# Patient Record
Sex: Male | Born: 1975 | Race: White | Hispanic: No | Marital: Single | Smoking: Former smoker
Health system: Southern US, Community
[De-identification: ages and names within clinical notes are randomized; demographics above are authoritative.]

## PROBLEM LIST (undated history)

## (undated) DIAGNOSIS — G8929 Other chronic pain: Secondary | ICD-10-CM

## (undated) DIAGNOSIS — S62325B Displaced fracture of shaft of fourth metacarpal bone, left hand, initial encounter for open fracture: Secondary | ICD-10-CM

## (undated) DIAGNOSIS — S62323B Displaced fracture of shaft of third metacarpal bone, left hand, initial encounter for open fracture: Secondary | ICD-10-CM

## (undated) DIAGNOSIS — M549 Dorsalgia, unspecified: Secondary | ICD-10-CM

## (undated) DIAGNOSIS — E119 Type 2 diabetes mellitus without complications: Secondary | ICD-10-CM

## (undated) HISTORY — PX: OTHER SURGICAL HISTORY: SHX169

## (undated) HISTORY — DX: Displaced fracture of shaft of fourth metacarpal bone, left hand, initial encounter for open fracture: S62.325B

## (undated) HISTORY — PX: HERNIA REPAIR: SHX51

## (undated) HISTORY — DX: Displaced fracture of shaft of third metacarpal bone, left hand, initial encounter for open fracture: S62.323B

---

## 1999-04-03 ENCOUNTER — Emergency Department (HOSPITAL_COMMUNITY): Admission: EM | Admit: 1999-04-03 | Discharge: 1999-04-03 | Payer: Self-pay | Admitting: Emergency Medicine

## 1999-06-30 ENCOUNTER — Emergency Department (HOSPITAL_COMMUNITY): Admission: EM | Admit: 1999-06-30 | Discharge: 1999-06-30 | Payer: Self-pay | Admitting: Emergency Medicine

## 2000-08-21 ENCOUNTER — Encounter: Payer: Self-pay | Admitting: Emergency Medicine

## 2000-08-21 ENCOUNTER — Emergency Department (HOSPITAL_COMMUNITY): Admission: EM | Admit: 2000-08-21 | Discharge: 2000-08-21 | Payer: Self-pay | Admitting: Emergency Medicine

## 2000-11-08 ENCOUNTER — Emergency Department (HOSPITAL_COMMUNITY): Admission: EM | Admit: 2000-11-08 | Discharge: 2000-11-08 | Payer: Self-pay | Admitting: *Deleted

## 2001-08-17 ENCOUNTER — Emergency Department (HOSPITAL_COMMUNITY): Admission: EM | Admit: 2001-08-17 | Discharge: 2001-08-17 | Payer: Self-pay | Admitting: *Deleted

## 2002-08-22 ENCOUNTER — Emergency Department (HOSPITAL_COMMUNITY): Admission: EM | Admit: 2002-08-22 | Discharge: 2002-08-22 | Payer: Self-pay | Admitting: Emergency Medicine

## 2002-08-22 ENCOUNTER — Encounter: Payer: Self-pay | Admitting: Emergency Medicine

## 2007-07-31 ENCOUNTER — Emergency Department (HOSPITAL_COMMUNITY): Admission: EM | Admit: 2007-07-31 | Discharge: 2007-07-31 | Payer: Self-pay | Admitting: Emergency Medicine

## 2007-08-10 ENCOUNTER — Ambulatory Visit: Payer: Self-pay | Admitting: *Deleted

## 2007-08-10 ENCOUNTER — Ambulatory Visit: Payer: Self-pay | Admitting: Nurse Practitioner

## 2007-08-10 DIAGNOSIS — K5900 Constipation, unspecified: Secondary | ICD-10-CM | POA: Insufficient documentation

## 2007-08-10 DIAGNOSIS — F319 Bipolar disorder, unspecified: Secondary | ICD-10-CM

## 2007-08-10 DIAGNOSIS — M545 Low back pain, unspecified: Secondary | ICD-10-CM | POA: Insufficient documentation

## 2007-08-10 DIAGNOSIS — E109 Type 1 diabetes mellitus without complications: Secondary | ICD-10-CM | POA: Insufficient documentation

## 2007-09-07 ENCOUNTER — Ambulatory Visit: Payer: Self-pay | Admitting: Nurse Practitioner

## 2007-09-07 DIAGNOSIS — J309 Allergic rhinitis, unspecified: Secondary | ICD-10-CM | POA: Insufficient documentation

## 2007-09-07 LAB — CONVERTED CEMR LAB: Blood Glucose, Fingerstick: 107

## 2007-09-14 ENCOUNTER — Telehealth (INDEPENDENT_AMBULATORY_CARE_PROVIDER_SITE_OTHER): Payer: Self-pay | Admitting: Nurse Practitioner

## 2007-09-28 ENCOUNTER — Encounter (INDEPENDENT_AMBULATORY_CARE_PROVIDER_SITE_OTHER): Payer: Self-pay | Admitting: *Deleted

## 2007-12-05 ENCOUNTER — Ambulatory Visit: Payer: Self-pay | Admitting: Nurse Practitioner

## 2007-12-05 DIAGNOSIS — R259 Unspecified abnormal involuntary movements: Secondary | ICD-10-CM | POA: Insufficient documentation

## 2007-12-05 DIAGNOSIS — F329 Major depressive disorder, single episode, unspecified: Secondary | ICD-10-CM | POA: Insufficient documentation

## 2007-12-05 LAB — CONVERTED CEMR LAB
BUN: 10 mg/dL (ref 6–23)
Bilirubin Urine: NEGATIVE
Blood Glucose, Fingerstick: 79
Blood in Urine, dipstick: NEGATIVE
CO2: 26 meq/L (ref 19–32)
Calcium: 9.4 mg/dL (ref 8.4–10.5)
Chloride: 107 meq/L (ref 96–112)
Cholesterol: 159 mg/dL (ref 0–200)
Creatinine, Ser: 1.12 mg/dL (ref 0.40–1.50)
Glucose, Bld: 21 mg/dL — CL (ref 70–99)
Glucose, Urine, Semiquant: NEGATIVE
HDL: 56 mg/dL (ref >39)
Hgb A1c MFr Bld: 7.4 %
Ketones, urine, test strip: NEGATIVE
LDL Cholesterol: 87 mg/dL (ref 0–99)
Nitrite: NEGATIVE
Potassium: 4.2 meq/L (ref 3.5–5.3)
Sodium: 144 meq/L (ref 135–145)
Specific Gravity, Urine: 1.02
Total CHOL/HDL Ratio: 2.8
Triglycerides: 82 mg/dL (ref <150)
Urobilinogen, UA: 0.2
VLDL: 16 mg/dL (ref 0–40)
pH: 6

## 2007-12-06 ENCOUNTER — Encounter (INDEPENDENT_AMBULATORY_CARE_PROVIDER_SITE_OTHER): Payer: Self-pay | Admitting: Nurse Practitioner

## 2007-12-25 ENCOUNTER — Ambulatory Visit: Payer: Self-pay | Admitting: Nurse Practitioner

## 2007-12-25 DIAGNOSIS — K4020 Bilateral inguinal hernia, without obstruction or gangrene, not specified as recurrent: Secondary | ICD-10-CM | POA: Insufficient documentation

## 2007-12-25 LAB — CONVERTED CEMR LAB
Bilirubin Urine: NEGATIVE
Blood Glucose, Fingerstick: 142
Blood in Urine, dipstick: NEGATIVE
Glucose, Urine, Semiquant: 100
Ketones, urine, test strip: NEGATIVE
Nitrite: NEGATIVE
Protein, U semiquant: NEGATIVE
Specific Gravity, Urine: 1.015
Urobilinogen, UA: 0.2
pH: 5.5

## 2007-12-26 ENCOUNTER — Telehealth (INDEPENDENT_AMBULATORY_CARE_PROVIDER_SITE_OTHER): Payer: Self-pay | Admitting: Nurse Practitioner

## 2008-01-25 ENCOUNTER — Encounter (INDEPENDENT_AMBULATORY_CARE_PROVIDER_SITE_OTHER): Payer: Self-pay | Admitting: Nurse Practitioner

## 2010-06-13 LAB — CONVERTED CEMR LAB
ALT: 17 units/L (ref 0–53)
AST: 17 units/L (ref 0–37)
Albumin: 4.6 g/dL (ref 3.5–5.2)
Alkaline Phosphatase: 92 units/L (ref 39–117)
BUN: 14 mg/dL (ref 6–23)
Basophils Absolute: 0 10*3/uL (ref 0.0–0.1)
Basophils Relative: 1 % (ref 0–1)
Bilirubin Urine: NEGATIVE
Blood Glucose, Fingerstick: 125
Blood in Urine, dipstick: NEGATIVE
CO2: 23 meq/L (ref 19–32)
Calcium: 9.2 mg/dL (ref 8.4–10.5)
Chloride: 108 meq/L (ref 96–112)
Creatinine, Ser: 1.02 mg/dL (ref 0.40–1.50)
Eosinophils Absolute: 0.2 10*3/uL (ref 0.0–0.7)
Eosinophils Relative: 3 % (ref 0–5)
Glucose, Bld: 79 mg/dL (ref 70–99)
Glucose, Urine, Semiquant: NEGATIVE
HCT: 46.2 % (ref 39.0–52.0)
Hemoglobin: 16.1 g/dL (ref 13.0–17.0)
Hgb A1c MFr Bld: 7.3 %
Ketones, urine, test strip: NEGATIVE
Lymphocytes Relative: 28 % (ref 12–46)
Lymphs Abs: 2 10*3/uL (ref 0.7–4.0)
MCHC: 34.8 g/dL (ref 30.0–36.0)
MCV: 87 fL (ref 78.0–100.0)
Microalb, Ur: 0.46 mg/dL (ref 0.00–1.89)
Monocytes Absolute: 0.5 10*3/uL (ref 0.1–1.0)
Monocytes Relative: 7 % (ref 3–12)
Neutro Abs: 4.3 10*3/uL (ref 1.7–7.7)
Neutrophils Relative %: 62 % (ref 43–77)
Nitrite: NEGATIVE
Platelets: 251 10*3/uL (ref 150–400)
Potassium: 4.5 meq/L (ref 3.5–5.3)
Protein, U semiquant: NEGATIVE
RBC: 5.31 M/uL (ref 4.22–5.81)
RDW: 13.3 % (ref 11.5–15.5)
Sodium: 143 meq/L (ref 135–145)
Specific Gravity, Urine: 1.015
TSH: 1.22 microintl units/mL (ref 0.350–5.50)
Total Bilirubin: 0.7 mg/dL (ref 0.3–1.2)
Total Protein: 6.9 g/dL (ref 6.0–8.3)
Urobilinogen, UA: 0.2
WBC: 7.1 10*3/uL (ref 4.0–10.5)
pH: 6

## 2012-06-29 DIAGNOSIS — I10 Essential (primary) hypertension: Secondary | ICD-10-CM | POA: Insufficient documentation

## 2013-03-27 DIAGNOSIS — E785 Hyperlipidemia, unspecified: Secondary | ICD-10-CM | POA: Insufficient documentation

## 2014-01-29 ENCOUNTER — Emergency Department (HOSPITAL_BASED_OUTPATIENT_CLINIC_OR_DEPARTMENT_OTHER)
Admission: EM | Admit: 2014-01-29 | Discharge: 2014-01-29 | Disposition: A | Discharge status: Home / Self Care | Payer: Self-pay | Attending: Emergency Medicine | Admitting: Emergency Medicine

## 2014-01-29 ENCOUNTER — Encounter (HOSPITAL_BASED_OUTPATIENT_CLINIC_OR_DEPARTMENT_OTHER): Payer: Self-pay | Admitting: Emergency Medicine

## 2014-01-29 DIAGNOSIS — Z791 Long term (current) use of non-steroidal anti-inflammatories (NSAID): Secondary | ICD-10-CM | POA: Insufficient documentation

## 2014-01-29 DIAGNOSIS — G4700 Insomnia, unspecified: Secondary | ICD-10-CM | POA: Insufficient documentation

## 2014-01-29 DIAGNOSIS — F17200 Nicotine dependence, unspecified, uncomplicated: Secondary | ICD-10-CM | POA: Insufficient documentation

## 2014-01-29 DIAGNOSIS — R739 Hyperglycemia, unspecified: Secondary | ICD-10-CM

## 2014-01-29 DIAGNOSIS — Z79899 Other long term (current) drug therapy: Secondary | ICD-10-CM | POA: Insufficient documentation

## 2014-01-29 DIAGNOSIS — Z880 Allergy status to penicillin: Secondary | ICD-10-CM | POA: Insufficient documentation

## 2014-01-29 DIAGNOSIS — Z9889 Other specified postprocedural states: Secondary | ICD-10-CM | POA: Insufficient documentation

## 2014-01-29 DIAGNOSIS — G8929 Other chronic pain: Secondary | ICD-10-CM | POA: Insufficient documentation

## 2014-01-29 DIAGNOSIS — Z794 Long term (current) use of insulin: Secondary | ICD-10-CM | POA: Insufficient documentation

## 2014-01-29 DIAGNOSIS — E119 Type 2 diabetes mellitus without complications: Secondary | ICD-10-CM | POA: Insufficient documentation

## 2014-01-29 HISTORY — DX: Dorsalgia, unspecified: M54.9

## 2014-01-29 HISTORY — DX: Other chronic pain: G89.29

## 2014-01-29 HISTORY — DX: Type 2 diabetes mellitus without complications: E11.9

## 2014-01-29 LAB — CBC WITH DIFFERENTIAL/PLATELET
BASOS PCT: 1 % (ref 0–1)
Basophils Absolute: 0.1 10*3/uL (ref 0.0–0.1)
Eosinophils Absolute: 0.2 10*3/uL (ref 0.0–0.7)
Eosinophils Relative: 2 % (ref 0–5)
HEMATOCRIT: 43.9 % (ref 39.0–52.0)
Hemoglobin: 15.1 g/dL (ref 13.0–17.0)
Lymphocytes Relative: 19 % (ref 12–46)
Lymphs Abs: 2.1 10*3/uL (ref 0.7–4.0)
MCH: 29.7 pg (ref 26.0–34.0)
MCHC: 34.4 g/dL (ref 30.0–36.0)
MCV: 86.4 fL (ref 78.0–100.0)
MONO ABS: 0.9 10*3/uL (ref 0.1–1.0)
Monocytes Relative: 9 % (ref 3–12)
NEUTROS ABS: 7.5 10*3/uL (ref 1.7–7.7)
Neutrophils Relative %: 69 % (ref 43–77)
Platelets: 286 10*3/uL (ref 150–400)
RBC: 5.08 MIL/uL (ref 4.22–5.81)
RDW: 12.8 % (ref 11.5–15.5)
WBC: 10.7 10*3/uL — ABNORMAL HIGH (ref 4.0–10.5)

## 2014-01-29 LAB — BASIC METABOLIC PANEL
ANION GAP: 12 (ref 5–15)
BUN: 18 mg/dL (ref 6–23)
CALCIUM: 9.5 mg/dL (ref 8.4–10.5)
CO2: 25 mEq/L (ref 19–32)
CREATININE: 0.8 mg/dL (ref 0.50–1.35)
Chloride: 103 mEq/L (ref 96–112)
GFR calc non Af Amer: >90 mL/min (ref >90)
Glucose, Bld: 187 mg/dL — ABNORMAL HIGH (ref 70–99)
Potassium: 4.3 mEq/L (ref 3.7–5.3)
Sodium: 140 mEq/L (ref 137–147)

## 2014-01-29 LAB — CBG MONITORING, ED: GLUCOSE-CAPILLARY: 240 mg/dL — AB (ref 70–99)

## 2014-01-29 NOTE — Discharge Instructions (Signed)

## 2014-01-29 NOTE — ED Provider Notes (Signed)
CSN: [id number]     Arrival date & time 01/29/14  1024 History   First MD Initiated Contact with Patient 01/29/14 1119     Chief Complaint  Patient presents with  . Insomnia     (Consider location/radiation/quality/duration/timing/severity/associated sxs/prior Treatment) HPI Comments: Patient is a 38 year old male with history of diabetes and takes insulin. He presents today for evaluation of erratic blood sugars. [facility] he is undergoing treatment for alcohol abuse. His sugars have been fluctuating between low and high and was sent here by their staff for evaluation. He denies to me he is experiencing any nausea, vomiting, or diarrhea. His only complaints are back pain that makes it difficult for him to get rest at night.  The history is provided by the patient.    Past Medical History  Diagnosis Date  . Chronic back pain   . Diabetes mellitus without complication    Past Surgical History  Procedure Laterality Date  . Hernia repair    . Open heart surgery- valve     No family history on file. History  Substance Use Topics  . Smoking status: Current Every Day Smoker -- 0.50 packs/day    Types: Cigarettes  . Smokeless tobacco: Not on file  . Alcohol Use: Yes     Comment: Last etoh 01/01/2014    Review of Systems  All other systems reviewed and are negative.     Allergies  Penicillins  Home Medications   Prior to Admission medications   Medication Sig Start Date End Date Taking? Authorizing Provider  busPIRone (BUSPAR) 10 MG tablet Take 10 mg by mouth 2 (two) times daily.   Yes Historical Provider, MD  citalopram (CELEXA) 20 MG tablet Take 20 mg by mouth daily.   Yes Historical Provider, MD  insulin glargine (LANTUS) 100 UNIT/ML injection Inject 64 Units into the skin at bedtime.   Yes Historical Provider, MD  insulin lispro (HUMALOG) 100 UNIT/ML injection Inject into the skin 3 (three) times daily before meals.   Yes Historical  Provider, MD  Melatonin 3 MG CAPS Take by mouth.   Yes Historical Provider, MD  meloxicam (MOBIC) 15 MG tablet Take 15 mg by mouth daily.   Yes Historical Provider, MD  pravastatin (PRAVACHOL) 40 MG tablet Take 40 mg by mouth daily.   Yes Historical Provider, MD   BP 139/98  Pulse 75  Temp(Src) 98.7 F (37.1 C) (Oral)  Resp 16  Ht  (1.753 m)  Wt 190 lb (86.183 kg)  BMI 28.05 kg/m2  SpO2 99% Physical Exam  Nursing note and vitals reviewed. Constitutional: He is oriented to person, place, and time. He appears well-developed and well-nourished. No distress.  HENT:  Head: Normocephalic and atraumatic.  Neck: Normal range of motion. Neck supple.  Cardiovascular: Normal rate, regular rhythm and normal heart sounds.   No murmur heard. Pulmonary/Chest: Effort normal and breath sounds normal. No respiratory distress. He has no wheezes.  Abdominal: Soft. Bowel sounds are normal.  Musculoskeletal: Normal range of motion. He exhibits no edema.  Neurological: He is alert and oriented to person, place, and time.  Skin: Skin is warm and dry. He is not diaphoretic.    ED Course  Procedures (including critical care time) Labs Review Labs Reviewed  CBG MONITORING, ED - Abnormal; Notable for the following:    Glucose-Capillary 240 (*)    All other components within normal limits  CBC WITH DIFFERENTIAL  BASIC METABOLIC PANEL  Imaging Review No results found.   EKG Interpretation None      MDM   Final diagnoses:  None    Laboratory studies are unremarkable. I see nothing emergent that needs tended to. He has an appointment tomorrow with his primary Dr. and I feel as though he is very stable to discuss his sugars at that time.    Geoffery Lyons, MD 01/29/14 [phone]

## 2014-01-29 NOTE — ED Notes (Signed)
Insomnia, chronic back pain and feels like BS is not regulated.  Fasting CBG 68 this am and "jumped up to the 400's" .  Resides at [facility] x 1 week.

## 2014-05-25 ENCOUNTER — Emergency Department (HOSPITAL_COMMUNITY)
Admission: EM | Admit: 2014-05-25 | Discharge: 2014-05-25 | Disposition: A | Discharge status: Home / Self Care | Payer: Self-pay | Attending: Emergency Medicine | Admitting: Emergency Medicine

## 2014-05-25 ENCOUNTER — Encounter (HOSPITAL_COMMUNITY): Payer: Self-pay | Admitting: *Deleted

## 2014-05-25 DIAGNOSIS — G8929 Other chronic pain: Secondary | ICD-10-CM | POA: Insufficient documentation

## 2014-05-25 DIAGNOSIS — Z794 Long term (current) use of insulin: Secondary | ICD-10-CM | POA: Insufficient documentation

## 2014-05-25 DIAGNOSIS — Z79899 Other long term (current) drug therapy: Secondary | ICD-10-CM | POA: Insufficient documentation

## 2014-05-25 DIAGNOSIS — Z720 Tobacco use: Secondary | ICD-10-CM | POA: Insufficient documentation

## 2014-05-25 DIAGNOSIS — Z791 Long term (current) use of non-steroidal anti-inflammatories (NSAID): Secondary | ICD-10-CM | POA: Insufficient documentation

## 2014-05-25 DIAGNOSIS — E1165 Type 2 diabetes mellitus with hyperglycemia: Secondary | ICD-10-CM | POA: Insufficient documentation

## 2014-05-25 DIAGNOSIS — Z880 Allergy status to penicillin: Secondary | ICD-10-CM | POA: Insufficient documentation

## 2014-05-25 DIAGNOSIS — R739 Hyperglycemia, unspecified: Secondary | ICD-10-CM

## 2014-05-25 LAB — CBC WITH DIFFERENTIAL/PLATELET
BASOS PCT: 0 % (ref 0–1)
Basophils Absolute: 0 10*3/uL (ref 0.0–0.1)
EOS PCT: 1 % (ref 0–5)
Eosinophils Absolute: 0.1 10*3/uL (ref 0.0–0.7)
HCT: 42.9 % (ref 39.0–52.0)
Hemoglobin: 14.8 g/dL (ref 13.0–17.0)
LYMPHS ABS: 2 10*3/uL (ref 0.7–4.0)
LYMPHS PCT: 26 % (ref 12–46)
MCH: 29.6 pg (ref 26.0–34.0)
MCHC: 34.5 g/dL (ref 30.0–36.0)
MCV: 85.8 fL (ref 78.0–100.0)
MONO ABS: 0.8 10*3/uL (ref 0.1–1.0)
Monocytes Relative: 10 % (ref 3–12)
NEUTROS ABS: 4.9 10*3/uL (ref 1.7–7.7)
Neutrophils Relative %: 63 % (ref 43–77)
Platelets: 214 10*3/uL (ref 150–400)
RBC: 5 MIL/uL (ref 4.22–5.81)
RDW: 12.6 % (ref 11.5–15.5)
WBC: 7.8 10*3/uL (ref 4.0–10.5)

## 2014-05-25 LAB — COMPREHENSIVE METABOLIC PANEL
ALT: 53 U/L (ref 0–53)
AST: 50 U/L — ABNORMAL HIGH (ref 0–37)
Albumin: 4 g/dL (ref 3.5–5.2)
Alkaline Phosphatase: 83 U/L (ref 39–117)
Anion gap: 14 (ref 5–15)
BILIRUBIN TOTAL: 1.1 mg/dL (ref 0.3–1.2)
BUN: 11 mg/dL (ref 6–23)
CALCIUM: 9.9 mg/dL (ref 8.4–10.5)
CO2: 27 mmol/L (ref 19–32)
CREATININE: 0.84 mg/dL (ref 0.50–1.35)
Chloride: 96 mEq/L (ref 96–112)
GFR calc Af Amer: >90 mL/min (ref >90)
GFR calc non Af Amer: >90 mL/min (ref >90)
GLUCOSE: 420 mg/dL — AB (ref 70–99)
Potassium: 3.7 mmol/L (ref 3.5–5.1)
Sodium: 137 mmol/L (ref 135–145)
Total Protein: 6.3 g/dL (ref 6.0–8.3)

## 2014-05-25 LAB — CBG MONITORING, ED
GLUCOSE-CAPILLARY: 422 mg/dL — AB (ref 70–99)
Glucose-Capillary: 409 mg/dL — ABNORMAL HIGH (ref 70–99)
Glucose-Capillary: 445 mg/dL — ABNORMAL HIGH (ref 70–99)
Glucose-Capillary: 453 mg/dL — ABNORMAL HIGH (ref 70–99)
Glucose-Capillary: 118 mg/dL — ABNORMAL HIGH (ref 70–99)

## 2014-05-25 LAB — URINALYSIS, ROUTINE W REFLEX MICROSCOPIC
Bilirubin Urine: NEGATIVE
HGB URINE DIPSTICK: NEGATIVE
KETONES UR: 15 mg/dL — AB
LEUKOCYTES UA: NEGATIVE
NITRITE: NEGATIVE
Protein, ur: NEGATIVE mg/dL
SPECIFIC GRAVITY, URINE: 1.039 — AB (ref 1.005–1.030)
UROBILINOGEN UA: 0.2 mg/dL (ref 0.0–1.0)
pH: 7.5 (ref 5.0–8.0)

## 2014-05-25 LAB — I-STAT TROPONIN, ED: TROPONIN I, POC: 0 ng/mL (ref 0.00–0.08)

## 2014-05-25 LAB — URINE MICROSCOPIC-ADD ON: URINE-OTHER: NONE SEEN

## 2014-05-25 LAB — LIPASE, BLOOD: Lipase: 17 U/L (ref 11–59)

## 2014-05-25 MED ORDER — SODIUM CHLORIDE 0.9 % IV BOLUS (SEPSIS): 1000.0000 mL | Freq: Once | INTRAVENOUS | Status: DC

## 2014-05-25 MED ORDER — SODIUM CHLORIDE 0.9 % IV SOLN
12.5000 mg | Freq: Once | INTRAVENOUS | Status: AC
  Administered 2014-05-25: 12.5 mg via INTRAVENOUS
  Filled 2014-05-25: qty 0.5

## 2014-05-25 MED ORDER — ONDANSETRON 4 MG PO TBDP: 4.0000 mg | ORAL_TABLET | Freq: Three times a day (TID) | ORAL | Status: DC | PRN

## 2014-05-25 MED ORDER — ONDANSETRON HCL 4 MG/2ML IJ SOLN
4.0000 mg | Freq: Once | INTRAMUSCULAR | Status: AC
  Administered 2014-05-25: 4 mg via INTRAVENOUS
  Filled 2014-05-25: qty 2

## 2014-05-25 MED ORDER — SODIUM CHLORIDE 0.9 % IV BOLUS (SEPSIS)
1000.0000 mL | Freq: Once | INTRAVENOUS | Status: AC
  Administered 2014-05-25: 1000 mL via INTRAVENOUS

## 2014-05-25 MED ORDER — PROMETHAZINE HCL 25 MG/ML IJ SOLN: 12.5000 mg | Freq: Once | INTRAMUSCULAR | Status: DC

## 2014-05-25 MED ORDER — METOCLOPRAMIDE HCL 5 MG/ML IJ SOLN
10.0000 mg | Freq: Once | INTRAMUSCULAR | Status: AC
  Administered 2014-05-25: 10 mg via INTRAVENOUS
  Filled 2014-05-25: qty 2

## 2014-05-25 MED ORDER — INSULIN ASPART 100 UNIT/ML ~~LOC~~ SOLN
10.0000 [IU] | Freq: Once | SUBCUTANEOUS | Status: AC
  Administered 2014-05-25: 10 [IU] via SUBCUTANEOUS
  Filled 2014-05-25: qty 1

## 2014-05-25 NOTE — ED Provider Notes (Signed)
CSN: [id number]     Arrival date & time 05/25/14  4098 History   First MD Initiated Contact with Patient 05/25/14 [phone]     Chief Complaint  Patient presents with  . Hyperglycemia  . Chest Pain  . Abdominal Pain  . Emesis     (Consider location/radiation/quality/duration/timing/severity/associated sxs/prior Treatment) HPI Comments: Patient presents with nausea vomiting and chest pain. He has a history of insulin-dependent diabetes and hyperlipidemia. He was recently admitted to [facility] hospital for hyperglycemia. He states that over the last 3-4 days he's felt like his sugars been high again these had ongoing nausea and vomiting. He's also had a 4 day history of chest pain associated with the vomiting. He has a burning in his esophagus as well as a tightness across his chest it's been fairly constant for the last 4 days. He also feels short of breath at times. He has a nonproductive cough. He denies any known fevers. He denies any diarrhea. He has some crampy pain to his abdomen. 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 He denies any drug use. He denies any recent alcohol use.  Patient is a 39 y.o. male presenting with hyperglycemia, chest pain, abdominal pain, and vomiting.  Hyperglycemia Associated symptoms: abdominal pain, chest pain, fatigue, nausea, shortness of breath and vomiting   Associated symptoms: no diaphoresis, no dizziness and no fever   Chest Pain Associated symptoms: abdominal pain, fatigue, nausea, shortness of breath and vomiting   Associated symptoms: no back pain, no cough, no diaphoresis, no dizziness, no fever, no headache, no numbness and no weakness   Abdominal Pain Associated symptoms: chest pain, fatigue, nausea, shortness of breath and vomiting   Associated symptoms: no chills, no cough, no diarrhea, no fever and no hematuria   Emesis Associated  symptoms: abdominal pain   Associated symptoms: no arthralgias, no chills, no diarrhea and no headaches     Past Medical History  Diagnosis Date  . Chronic back pain   . Diabetes mellitus without complication    Past Surgical History  Procedure Laterality Date  . Hernia repair    . Open heart surgery- valve     History reviewed. No pertinent family history. History  Substance Use Topics  . Smoking status: Current Every Day Smoker -- 0.50 packs/day    Types: Cigarettes  . Smokeless tobacco: Not on file  . Alcohol Use: Yes     Comment: Last etoh 01/01/2014    Review of Systems  Constitutional: Positive for fatigue. Negative for fever, chills and diaphoresis.  HENT: Negative for congestion, rhinorrhea and sneezing.   Eyes: Negative.   Respiratory: Positive for shortness of breath. Negative for cough and chest tightness.   Cardiovascular: Positive for chest pain. Negative for leg swelling.  Gastrointestinal: Positive for nausea, vomiting and abdominal pain. Negative for diarrhea and blood in stool.  Genitourinary: Negative for frequency, hematuria, flank pain and difficulty urinating.  Musculoskeletal: Negative for back pain and arthralgias.  Skin: Negative for rash.  Neurological: Negative for dizziness, speech difficulty, weakness, numbness and headaches.      Allergies  Penicillins  Home Medications   Prior to Admission medications   Medication Sig Start Date End Date Taking? Authorizing Provider  busPIRone (BUSPAR) 10 MG tablet Take 10 mg by mouth 2 (two) times daily as needed (for anxiety).    Yes Historical Provider, MD  citalopram (CELEXA)  20 MG tablet Take 20 mg by mouth daily.   Yes Historical Provider, MD  gabapentin (NEURONTIN) 300 MG capsule Take 300 mg by mouth 3 (three) times daily.   Yes Historical Provider, MD  hydrOXYzine (VISTARIL) 25 MG capsule Take 25 mg by mouth 3 (three) times daily as needed for anxiety.   Yes Historical Provider, MD  insulin  detemir (LEVEMIR) 100 UNIT/ML injection Inject 45 Units into the skin every evening.   Yes Historical Provider, MD  insulin glargine (LANTUS) 100 UNIT/ML injection Inject 45 Units into the skin every evening.    Yes Historical Provider, MD  insulin lispro (HUMALOG) 100 UNIT/ML injection Inject 3-18 Units into the skin 3 (three) times daily before meals. Sliding scale   Yes Historical Provider, MD  meloxicam (MOBIC) 15 MG tablet Take 15 mg by mouth daily.   Yes Historical Provider, MD  pravastatin (PRAVACHOL) 40 MG tablet Take 40 mg by mouth daily.   Yes Historical Provider, MD   BP 151/98 mmHg  Pulse 72  Temp(Src) 97.9 F (36.6 C) (Oral)  Resp 22  SpO2 97% Physical Exam  Constitutional: He is oriented to person, place, and time. He appears well-developed and well-nourished.  HENT:  Head: Normocephalic and atraumatic.  Mouth/Throat: Oropharynx is clear and moist.  Eyes: Pupils are equal, round, and reactive to light.  Neck: Normal range of motion. Neck supple.  Cardiovascular: Normal rate, regular rhythm and normal heart sounds.   Pulmonary/Chest: Effort normal and breath sounds normal. No respiratory distress. He has no wheezes. He has no rales. He exhibits no tenderness.  Abdominal: Soft. Bowel sounds are normal. There is no tenderness. There is no rebound and no guarding.  Musculoskeletal: Normal range of motion. He exhibits no edema.  Lymphadenopathy:    He has no cervical adenopathy.  Neurological: He is alert and oriented to person, place, and time.  Skin: Skin is warm and dry. No rash noted.  Psychiatric: He has a normal mood and affect.    ED Course  Procedures (including critical care time) Labs Review Labs Reviewed  COMPREHENSIVE METABOLIC PANEL - Abnormal; Notable for the following:    Glucose, Bld 420 (*)    AST 50 (*)    All other components within normal limits  URINALYSIS, ROUTINE W REFLEX MICROSCOPIC - Abnormal; Notable for the following:    Specific Gravity,  Urine 1.039 (*)    Glucose, UA >1000 (*)    Ketones, ur 15 (*)    All other components within normal limits  CBG MONITORING, ED - Abnormal; Notable for the following:    Glucose-Capillary 409 (*)    All other components within normal limits  CBG MONITORING, ED - Abnormal; Notable for the following:    Glucose-Capillary 445 (*)    All other components within normal limits  CBG MONITORING, ED - Abnormal; Notable for the following:    Glucose-Capillary 422 (*)    All other components within normal limits  CBG MONITORING, ED - Abnormal; Notable for the following:    Glucose-Capillary 453 (*)    All other components within normal limits  LIPASE, BLOOD  CBC WITH DIFFERENTIAL  URINE MICROSCOPIC-ADD ON  I-STAT TROPOININ, ED    Imaging Review No results found.   EKG Interpretation None      MDM   Final diagnoses:  Hyperglycemia   Patient with ST elevation inferiorly and anteriorly with no reciprocal changes. I will consult cardiology to look at the EKG.  10:17 spoke with Dr. Excell Seltzerooper  who will take a look at the EKG and call me back  10:25 Dr. Excell Seltzer feels EKG most consistent with early repol changes.  Trop negative.  Patient has no evidence of DKA. He's feeling much better after IV fluids. I feel he can be discharged home once his sugar is improving. He's been given insulin. Dr. Fayrene Fearing to discharge once his sugar has improved.  Rolan Bucco, MD 05/25/14 [phone]

## 2014-05-25 NOTE — ED Notes (Signed)
Bed: [id number] Expected date: 05/25/14 Expected time: 9:45 AM Means of arrival: Ambulance Comments: Hyperglycemia/ N/V

## 2014-05-25 NOTE — Discharge Instructions (Signed)
Hyperglycemia °Hyperglycemia occurs when the glucose (sugar) in your blood is too high. Hyperglycemia can happen for many reasons, but it most often happens to people who do not know they have diabetes or are not managing their diabetes properly.  °CAUSES  °Whether you have diabetes or not, there are other causes of hyperglycemia. Hyperglycemia can occur when you have diabetes, but it can also occur in other situations that you might not be as aware of, such as: °Diabetes °· If you have diabetes and are having problems controlling your blood glucose, hyperglycemia could occur because of some of the following reasons: °¨ Not following your meal plan. °¨ Not taking your diabetes medications or not taking it properly. °¨ Exercising less or doing less activity than you normally do. °¨ Being sick. °Pre-diabetes °· This cannot be ignored. Before people develop Type 2 diabetes, they almost always have "pre-diabetes." This is when your blood glucose levels are higher than normal, but not yet high enough to be diagnosed as diabetes. Research has shown that some long-term damage to the body, especially the heart and circulatory system, may already be occurring during pre-diabetes. If you take action to manage your blood glucose when you have pre-diabetes, you may delay or prevent Type 2 diabetes from developing. °Stress °· If you have diabetes, you may be "diet" controlled or on oral medications or insulin to control your diabetes. However, you may find that your blood glucose is higher than usual in the hospital whether you have diabetes or not. This is often referred to as "stress hyperglycemia." Stress can elevate your blood glucose. This happens because of hormones put out by the body during times of stress. If stress has been the cause of your high blood glucose, it can be followed regularly by your caregiver. That way he/she can make sure your hyperglycemia does not continue to get worse or progress to  diabetes. °Steroids °· Steroids are medications that act on the infection fighting system (immune system) to block inflammation or infection. One side effect can be a rise in blood glucose. Most people can produce enough extra insulin to allow for this rise, but for those who cannot, steroids make blood glucose levels go even higher. It is not unusual for steroid treatments to "uncover" diabetes that is developing. It is not always possible to determine if the hyperglycemia will go away after the steroids are stopped. A special blood test called an A1c is sometimes done to determine if your blood glucose was elevated before the steroids were started. °SYMPTOMS °· Thirsty. °· Frequent urination. °· Dry mouth. °· Blurred vision. °· Tired or fatigue. °· Weakness. °· Sleepy. °· Tingling in feet or leg. °DIAGNOSIS  °Diagnosis is made by monitoring blood glucose in one or all of the following ways: °· A1c test. This is a chemical found in your blood. °· Fingerstick blood glucose monitoring. °· Laboratory results. °TREATMENT  °First, knowing the cause of the hyperglycemia is important before the hyperglycemia can be treated. Treatment may include, but is not be limited to: °· Education. °· Change or adjustment in medications. °· Change or adjustment in meal plan. °· Treatment for an illness, infection, etc. °· More frequent blood glucose monitoring. °· Change in exercise plan. °· Decreasing or stopping steroids. °· Lifestyle changes. °HOME CARE INSTRUCTIONS  °· Test your blood glucose as directed. °· Exercise regularly. Your caregiver will give you instructions about exercise. Pre-diabetes or diabetes which comes on with stress is helped by exercising. °· Eat wholesome,   balanced meals. Eat often and at regular, fixed times. Your caregiver or nutritionist will give you a meal plan to guide your sugar intake.  Being at an ideal weight is important. If needed, losing as little as 10 to 15 pounds may help improve blood  glucose levels. SEEK MEDICAL CARE IF:   You have questions about medicine, activity, or diet.  You continue to have symptoms (problems such as increased thirst, urination, or weight gain). SEEK IMMEDIATE MEDICAL CARE IF:   You are vomiting or have diarrhea.  Your breath smells fruity.  You are breathing faster or slower.  You are very sleepy or incoherent.  You have numbness, tingling, or pain in your feet or hands.  You have chest pain.  Your symptoms get worse even though you have been following your caregiver's orders.  If you have any other questions or concerns. Document Released: 10/26/2000 Document Revised: 07/25/2011 Document Reviewed: 08/29/2011 [facility] Patient Information 2015 [place], [place]. This information is not intended to replace advice given to you by your health care provider. Make sure you discuss any questions you have with your health care provider.  Hyperglycemia Hyperglycemia occurs when the glucose (sugar) in your blood is too high. Hyperglycemia can happen for many reasons, but it most often happens to people who do not know they have diabetes or are not managing their diabetes properly.  CAUSES  Whether you have diabetes or not, there are other causes of hyperglycemia. Hyperglycemia can occur when you have diabetes, but it can also occur in other situations that you might not be as aware of, such as: Diabetes  If you have diabetes and are having problems controlling your blood glucose, hyperglycemia could occur because of some of the following reasons:  Not following your meal plan.  Not taking your diabetes medications or not taking it properly.  Exercising less or doing less activity than you normally do.  Being sick. Pre-diabetes  This cannot be ignored. Before people develop Type 2 diabetes, they almost always have "pre-diabetes." This is when your blood glucose levels are higher than normal, but not yet high enough to be diagnosed as  diabetes. Research has shown that some long-term damage to the body, especially the heart and circulatory system, may already be occurring during pre-diabetes. If you take action to manage your blood glucose when you have pre-diabetes, you may delay or prevent Type 2 diabetes from developing. Stress  If you have diabetes, you may be "diet" controlled or on oral medications or insulin to control your diabetes. However, you may find that your blood glucose is higher than usual in the hospital whether you have diabetes or not. This is often referred to as "stress hyperglycemia." Stress can elevate your blood glucose. This happens because of hormones put out by the body during times of stress. If stress has been the cause of your high blood glucose, it can be followed regularly by your caregiver. That way he/she can make sure your hyperglycemia does not continue to get worse or progress to diabetes. Steroids  Steroids are medications that act on the infection fighting system (immune system) to block inflammation or infection. One side effect can be a rise in blood glucose. Most people can produce enough extra insulin to allow for this rise, but for those who cannot, steroids make blood glucose levels go even higher. It is not unusual for steroid treatments to "uncover" diabetes that is developing. It is not always possible to determine if the hyperglycemia will go  away after the steroids are stopped. A special blood test called an A1c is sometimes done to determine if your blood glucose was elevated before the steroids were started. SYMPTOMS  Thirsty.  Frequent urination.  Dry mouth.  Blurred vision.  Tired or fatigue.  Weakness.  Sleepy.  Tingling in feet or leg. DIAGNOSIS  Diagnosis is made by monitoring blood glucose in one or all of the following ways:  A1c test. This is a chemical found in your blood.  Fingerstick blood glucose monitoring.  Laboratory results. TREATMENT  First,  knowing the cause of the hyperglycemia is important before the hyperglycemia can be treated. Treatment may include, but is not be limited to:  Education.  Change or adjustment in medications.  Change or adjustment in meal plan.  Treatment for an illness, infection, etc.  More frequent blood glucose monitoring.  Change in exercise plan.  Decreasing or stopping steroids.  Lifestyle changes. HOME CARE INSTRUCTIONS   Test your blood glucose as directed.  Exercise regularly. Your caregiver will give you instructions about exercise. Pre-diabetes or diabetes which comes on with stress is helped by exercising.  Eat wholesome, balanced meals. Eat often and at regular, fixed times. Your caregiver or nutritionist will give you a meal plan to guide your sugar intake.  Being at an ideal weight is important. If needed, losing as little as 10 to 15 pounds may help improve blood glucose levels. SEEK MEDICAL CARE IF:   You have questions about medicine, activity, or diet.  You continue to have symptoms (problems such as increased thirst, urination, or weight gain). SEEK IMMEDIATE MEDICAL CARE IF:   You are vomiting or have diarrhea.  Your breath smells fruity.  You are breathing faster or slower.  You are very sleepy or incoherent.  You have numbness, tingling, or pain in your feet or hands.  You have chest pain.  Your symptoms get worse even though you have been following your caregiver's orders.  If you have any other questions or concerns. Document Released: 10/26/2000 Document Revised: 07/25/2011 Document Reviewed: 08/29/2011 [facility] Patient Information 2015 [place], [place]. This information is not intended to replace advice given to you by your health care provider. Make sure you discuss any questions you have with your health care provider.

## 2014-05-25 NOTE — ED Provider Notes (Signed)
Patient recheck. Care was discussed with Dr. Fredderick PhenixBelfi.  Check blood sugar 125. [place] [facility]. Which changed from Lantus to let her. Recommended he continue his prescribed doses and his sliding scale for his regular insulin. Primary care follow-up. Return if worsening symptoms. Not acidotic. Taking by mouth food and fluid without difficulty here.  [name], MD 05/25/14 [phone]

## 2014-05-25 NOTE — ED Notes (Addendum)
Per ems pt c/o hyperglycemia x4 days, with nausea and vomiting x3 days. Pt was seen and treated at [facility] 4 days ago with DKA. Pt complaint with medications. Pt reports he felt his sugar was high last night so he took his lantus and novalog, still feels like it is high, ems CBG 299. Abdominal pain 7/10. Non productive cough present. Afebrile.   Upon further assessment pt reports chest pain x3 days, "feels like someone is stomping on my chest", reports SOB. Able to  Speak in full sentences.

## 2014-05-25 NOTE — ED Notes (Signed)
Pt escorted to discharge window. Pt verbalized understanding discharge instructions. In no acute distress.  

## 2014-05-25 NOTE — ED Notes (Addendum)
Pt tolerated diet gingerale well. 

## 2016-01-06 ENCOUNTER — Emergency Department (HOSPITAL_COMMUNITY): Payer: Self-pay

## 2016-01-06 ENCOUNTER — Emergency Department (HOSPITAL_COMMUNITY)
Admission: EM | Admit: 2016-01-06 | Discharge: 2016-01-07 | Disposition: A | Discharge status: Home / Self Care | Payer: Self-pay | Attending: Emergency Medicine | Admitting: Emergency Medicine

## 2016-01-06 ENCOUNTER — Encounter (HOSPITAL_COMMUNITY): Payer: Self-pay

## 2016-01-06 DIAGNOSIS — E109 Type 1 diabetes mellitus without complications: Secondary | ICD-10-CM | POA: Insufficient documentation

## 2016-01-06 DIAGNOSIS — Y92512 Supermarket, store or market as the place of occurrence of the external cause: Secondary | ICD-10-CM | POA: Insufficient documentation

## 2016-01-06 DIAGNOSIS — S8001XA Contusion of right knee, initial encounter: Secondary | ICD-10-CM | POA: Insufficient documentation

## 2016-01-06 DIAGNOSIS — M542 Cervicalgia: Secondary | ICD-10-CM | POA: Insufficient documentation

## 2016-01-06 DIAGNOSIS — F17210 Nicotine dependence, cigarettes, uncomplicated: Secondary | ICD-10-CM | POA: Insufficient documentation

## 2016-01-06 DIAGNOSIS — S0181XA Laceration without foreign body of other part of head, initial encounter: Secondary | ICD-10-CM | POA: Insufficient documentation

## 2016-01-06 DIAGNOSIS — S060X0A Concussion without loss of consciousness, initial encounter: Secondary | ICD-10-CM | POA: Insufficient documentation

## 2016-01-06 DIAGNOSIS — IMO0002 Reserved for concepts with insufficient information to code with codable children: Secondary | ICD-10-CM

## 2016-01-06 DIAGNOSIS — Y999 Unspecified external cause status: Secondary | ICD-10-CM | POA: Insufficient documentation

## 2016-01-06 DIAGNOSIS — W500XXA Accidental hit or strike by another person, initial encounter: Secondary | ICD-10-CM | POA: Insufficient documentation

## 2016-01-06 DIAGNOSIS — S8002XA Contusion of left knee, initial encounter: Secondary | ICD-10-CM | POA: Insufficient documentation

## 2016-01-06 DIAGNOSIS — Y939 Activity, unspecified: Secondary | ICD-10-CM | POA: Insufficient documentation

## 2016-01-06 LAB — I-STAT CHEM 8, ED
BUN: 16 mg/dL (ref 6–20)
CHLORIDE: 98 mmol/L — AB (ref 101–111)
CREATININE: 0.9 mg/dL (ref 0.61–1.24)
Calcium, Ion: 1.16 mmol/L (ref 1.13–1.30)
Glucose, Bld: 463 mg/dL — ABNORMAL HIGH (ref 65–99)
HEMATOCRIT: 48 % (ref 39.0–52.0)
Hemoglobin: 16.3 g/dL (ref 13.0–17.0)
POTASSIUM: 4.8 mmol/L (ref 3.5–5.1)
Sodium: 133 mmol/L — ABNORMAL LOW (ref 135–145)
TCO2: 22 mmol/L (ref 0–100)

## 2016-01-06 LAB — CBG MONITORING, ED
GLUCOSE-CAPILLARY: 505 mg/dL — AB (ref 65–99)
Glucose-Capillary: 381 mg/dL — ABNORMAL HIGH (ref 65–99)

## 2016-01-06 MED ORDER — SODIUM CHLORIDE 0.9 % IV BOLUS (SEPSIS)
1000.0000 mL | Freq: Once | INTRAVENOUS | Status: AC
  Administered 2016-01-06: 1000 mL via INTRAVENOUS

## 2016-01-06 MED ORDER — LIDOCAINE-EPINEPHRINE (PF) 2 %-1:200000 IJ SOLN
10.0000 mL | Freq: Once | INTRAMUSCULAR | Status: AC
  Administered 2016-01-06: 10 mL
  Filled 2016-01-06: qty 20

## 2016-01-06 MED ORDER — IBUPROFEN 400 MG PO TABS
600.0000 mg | ORAL_TABLET | Freq: Once | ORAL | Status: AC
  Administered 2016-01-06: 600 mg via ORAL
  Filled 2016-01-06: qty 1

## 2016-01-06 NOTE — ED Notes (Signed)
Suture cart outside room.  

## 2016-01-06 NOTE — ED Notes (Signed)
[employer] at bedside 

## 2016-01-06 NOTE — ED Notes (Signed)
Pt removed his c-collar
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
## 2016-01-07 MED ORDER — INSULIN ASPART PROT & ASPART (70-30 MIX) 100 UNIT/ML ~~LOC~~ SUSP
15.0000 [IU] | Freq: Once | SUBCUTANEOUS | Status: AC
  Administered 2016-01-07: 15 [IU] via SUBCUTANEOUS
  Filled 2016-01-07: qty 10

## 2016-01-07 MED ORDER — NAPROXEN 500 MG PO TABS: 500.0000 mg | ORAL_TABLET | Freq: Two times a day (BID) | ORAL | 0 refills | Status: DC
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
## 2017-01-16 DIAGNOSIS — F1020 Alcohol dependence, uncomplicated: Secondary | ICD-10-CM | POA: Insufficient documentation

## 2017-01-16 DIAGNOSIS — J449 Chronic obstructive pulmonary disease, unspecified: Secondary | ICD-10-CM | POA: Insufficient documentation

## 2019-02-15 ENCOUNTER — Other Ambulatory Visit: Payer: Self-pay

## 2019-02-15 ENCOUNTER — Inpatient Hospital Stay (HOSPITAL_COMMUNITY)
Admission: EM | Admit: 2019-02-15 | Discharge: 2019-02-17 | DRG: 639 | Discharge status: Against Medical Advice | Payer: Self-pay | Attending: Internal Medicine | Admitting: Internal Medicine

## 2019-02-15 DIAGNOSIS — Z720 Tobacco use: Secondary | ICD-10-CM | POA: Diagnosis present

## 2019-02-15 DIAGNOSIS — R030 Elevated blood-pressure reading, without diagnosis of hypertension: Secondary | ICD-10-CM | POA: Diagnosis present

## 2019-02-15 DIAGNOSIS — Z23 Encounter for immunization: Secondary | ICD-10-CM

## 2019-02-15 DIAGNOSIS — Z66 Do not resuscitate: Secondary | ICD-10-CM

## 2019-02-15 DIAGNOSIS — K3184 Gastroparesis: Secondary | ICD-10-CM | POA: Diagnosis present

## 2019-02-15 DIAGNOSIS — E1043 Type 1 diabetes mellitus with diabetic autonomic (poly)neuropathy: Secondary | ICD-10-CM | POA: Diagnosis present

## 2019-02-15 DIAGNOSIS — G8929 Other chronic pain: Secondary | ICD-10-CM | POA: Diagnosis present

## 2019-02-15 DIAGNOSIS — M545 Low back pain: Secondary | ICD-10-CM | POA: Diagnosis present

## 2019-02-15 DIAGNOSIS — Z833 Family history of diabetes mellitus: Secondary | ICD-10-CM

## 2019-02-15 DIAGNOSIS — R109 Unspecified abdominal pain: Secondary | ICD-10-CM | POA: Diagnosis present

## 2019-02-15 DIAGNOSIS — D72829 Elevated white blood cell count, unspecified: Secondary | ICD-10-CM | POA: Diagnosis present

## 2019-02-15 DIAGNOSIS — K920 Hematemesis: Secondary | ICD-10-CM | POA: Diagnosis present

## 2019-02-15 DIAGNOSIS — F17210 Nicotine dependence, cigarettes, uncomplicated: Secondary | ICD-10-CM | POA: Diagnosis present

## 2019-02-15 DIAGNOSIS — R7989 Other specified abnormal findings of blood chemistry: Secondary | ICD-10-CM

## 2019-02-15 DIAGNOSIS — Z91120 Patient's intentional underdosing of medication regimen due to financial hardship: Secondary | ICD-10-CM

## 2019-02-15 DIAGNOSIS — E1110 Type 2 diabetes mellitus with ketoacidosis without coma: Secondary | ICD-10-CM | POA: Diagnosis present

## 2019-02-15 DIAGNOSIS — E1010 Type 1 diabetes mellitus with ketoacidosis without coma: Principal | ICD-10-CM | POA: Diagnosis present

## 2019-02-15 DIAGNOSIS — R112 Nausea with vomiting, unspecified: Secondary | ICD-10-CM | POA: Diagnosis present

## 2019-02-15 DIAGNOSIS — R1011 Right upper quadrant pain: Secondary | ICD-10-CM | POA: Diagnosis present

## 2019-02-15 DIAGNOSIS — R945 Abnormal results of liver function studies: Secondary | ICD-10-CM | POA: Diagnosis present

## 2019-02-15 DIAGNOSIS — T383X6A Underdosing of insulin and oral hypoglycemic [antidiabetic] drugs, initial encounter: Secondary | ICD-10-CM | POA: Diagnosis present

## 2019-02-15 DIAGNOSIS — F1910 Other psychoactive substance abuse, uncomplicated: Secondary | ICD-10-CM | POA: Diagnosis present

## 2019-02-15 DIAGNOSIS — R000 Tachycardia, unspecified: Secondary | ICD-10-CM | POA: Diagnosis present

## 2019-02-15 DIAGNOSIS — Z20828 Contact with and (suspected) exposure to other viral communicable diseases: Secondary | ICD-10-CM | POA: Diagnosis present

## 2019-02-15 DIAGNOSIS — B1920 Unspecified viral hepatitis C without hepatic coma: Secondary | ICD-10-CM | POA: Diagnosis present

## 2019-02-15 DIAGNOSIS — Z5329 Procedure and treatment not carried out because of patient's decision for other reasons: Secondary | ICD-10-CM | POA: Diagnosis present

## 2019-02-15 DIAGNOSIS — E109 Type 1 diabetes mellitus without complications: Secondary | ICD-10-CM | POA: Diagnosis present

## 2019-02-15 LAB — CBC WITH DIFFERENTIAL/PLATELET
Abs Immature Granulocytes: 0.32 10*3/uL — ABNORMAL HIGH (ref 0.00–0.07)
Basophils Absolute: 0.1 10*3/uL (ref 0.0–0.1)
Basophils Relative: 0 %
Eosinophils Absolute: 0 10*3/uL (ref 0.0–0.5)
Eosinophils Relative: 0 %
HCT: 48 % (ref 39.0–52.0)
Hemoglobin: 16.7 g/dL (ref 13.0–17.0)
Immature Granulocytes: 1 %
Lymphocytes Relative: 6 %
Lymphs Abs: 1.7 10*3/uL (ref 0.7–4.0)
MCH: 28.4 pg (ref 26.0–34.0)
MCHC: 34.8 g/dL (ref 30.0–36.0)
MCV: 81.5 fL (ref 80.0–100.0)
Monocytes Absolute: 1.8 10*3/uL — ABNORMAL HIGH (ref 0.1–1.0)
Monocytes Relative: 6 %
Neutro Abs: 24.8 10*3/uL — ABNORMAL HIGH (ref 1.7–7.7)
Neutrophils Relative %: 87 %
Platelets: 395 10*3/uL (ref 150–400)
RBC: 5.89 MIL/uL — ABNORMAL HIGH (ref 4.22–5.81)
RDW: 12.8 % (ref 11.5–15.5)
WBC: 28.7 10*3/uL — ABNORMAL HIGH (ref 4.0–10.5)
nRBC: 0 % (ref 0.0–0.2)

## 2019-02-15 LAB — LIPASE, BLOOD: Lipase: 23 U/L (ref 11–51)

## 2019-02-15 LAB — PROTIME-INR
INR: 0.9 (ref 0.8–1.2)
Prothrombin Time: 12.3 seconds (ref 11.4–15.2)

## 2019-02-15 LAB — CBG MONITORING, ED: Glucose-Capillary: 566 mg/dL (ref 70–99)

## 2019-02-15 LAB — POC OCCULT BLOOD, ED: Fecal Occult Bld: NEGATIVE

## 2019-02-15 MED ORDER — SODIUM CHLORIDE 0.9 % IV BOLUS
1000.0000 mL | Freq: Once | INTRAVENOUS | Status: AC
  Administered 2019-02-15: 1000 mL via INTRAVENOUS

## 2019-02-15 MED ORDER — ONDANSETRON HCL 4 MG/2ML IJ SOLN
4.0000 mg | Freq: Once | INTRAMUSCULAR | Status: AC
  Administered 2019-02-15: 22:00:00 4 mg via INTRAVENOUS
  Filled 2019-02-15: qty 2

## 2019-02-15 NOTE — ED Provider Notes (Addendum)
  Physical Exam  BP (!) 145/100   Pulse 98   Temp 98.8 F (37.1 C) (Oral)   Resp 13   Ht '5\' 10"'$  (1.778 m)   Wt 72.6 kg   SpO2 98%   BMI 22.96 kg/m   Physical Exam  ED Course/Procedures   Clinical Course as of Feb 16 151  Sat Feb 16, 2019  0008 LFTs are elevated with bilirubin of 3.  On reassessment patient is having right upper quadrant tenderness.  Alk phos is 178. Ultrasound right upper quadrant ordered.  Patient's BUN-creatinine-bicarb and anion gap are still pending.   [AN]    Clinical Course User Index [AN] Varney Biles, MD    .Critical Care Performed by: Varney Biles, MD Authorized by: Varney Biles, MD   Critical care provider statement:    Critical care time (minutes):  36   Critical care was necessary to treat or prevent imminent or life-threatening deterioration of the following conditions:  Metabolic crisis   Critical care was time spent personally by me on the following activities:  Discussions with consultants, evaluation of patient's response to treatment, examination of patient, ordering and performing treatments and interventions, ordering and review of laboratory studies, ordering and review of radiographic studies, pulse oximetry, re-evaluation of patient's condition, obtaining history from patient or surrogate and review of old charts    MDM   Assuming care of patient from Dr. Francia Greaves.   Patient in the ED for for nausea and vomiting. Workup thus far shows elevated WC with rest of the labs pending. CBG > 500.  Concerning findings are as following: non yet. Important pending results are : all labs.  According to Dr. Francia Greaves, plan is to f/u on the labs.  No need for CT scans based on his initial assessment.  Patient had no complains, no concerns from the nursing side. Will continue to monitor.     Varney Biles, MD 02/15/19 2317   1:52 AM Patient has a gap of 22.  He is still having nausea and vomiting.  Ultrasound right upper  quadrant is negative for any acute findings.  LFTs are elevated and so his bilirubin over 3. Insulin drip initiated. Patient is still nauseated therefore we will proceed with admission. I do not think he needs a CT scan at this time.  Will defer need for CT to admitting staff and their assessment.    Varney Biles, MD 02/16/19 [phone]

## 2019-02-15 NOTE — ED Triage Notes (Signed)
Patient's girlfriend called d/t patient N/V x 2 days with abdominal & esophageal pain. Emesis dark-colored/coffee ground. Per EMS

## 2019-02-15 NOTE — ED Provider Notes (Signed)
[facility]-EMERGENCY DEPT Provider Note   CSN: [id number] Arrival date & time: 02/15/19  2049     History   Chief Complaint Chief Complaint  Patient presents with  . Abdominal Pain    HPI Ryan Richard is a 43 y.o. male.     43 year old male with prior medical history as detailed below presents for nausea and vomiting.  Patient reports persistent continuous vomiting for the last 48 hours.  He reports that sugars have been running high.  He denies fever.  He denies chest pain or shortness of breath.  The history is provided by the patient and medical records.  Emesis Severity:  Moderate Duration:  2 days Timing:  Constant Progression:  Worsening Chronicity:  New Relieved by:  Nothing Worsened by:  Nothing   Past Medical History:  Diagnosis Date  . Chronic back pain   . Diabetes mellitus without complication [facility])     Patient Active Problem List   Diagnosis Date Noted  . INGUINAL HERNIAS, BILATERAL 12/25/2007  . DEPRESSIVE DISORDER 12/05/2007  . TWITCHING 12/05/2007  . ALLERGIC RHINITIS 09/07/2007  . DIABETES MELLITUS, TYPE I 08/10/2007  . BIPOLAR AFFECTIVE DISORDER 08/10/2007  . CONSTIPATION 08/10/2007  . LUMBAGO 08/10/2007    Past Surgical History:  Procedure Laterality Date  . HERNIA REPAIR    . open heart surgery- valve          Home Medications    Prior to Admission medications   Medication Sig Start Date End Date Taking? Authorizing Provider  insulin detemir (LEVEMIR) 100 UNIT/ML injection Inject 45 Units into the skin every evening.   Yes [provider]  insulin glargine (LANTUS) 100 UNIT/ML injection Inject 45 Units into the skin every evening.    Yes [provider]  insulin lispro (HUMALOG) 100 UNIT/ML injection Inject 3-18 Units into the skin 3 (three) times daily before meals. Sliding scale   Yes [provider]  naproxen (NAPROSYN) 500 MG tablet Take 1 tablet (500 mg total) by mouth 2 (two)  times daily. Patient not taking: Reported on 02/15/2019 01/07/16   Derwood Kaplan, MD  ondansetron (ZOFRAN ODT) 4 MG disintegrating tablet Take 1 tablet (4 mg total) by mouth every 8 (eight) hours as needed for nausea. Patient not taking: Reported on 02/15/2019 05/25/14   Rolland Porter, MD    Family History No family history on file.  Social History Social History   Tobacco Use  . Smoking status: Current Every Day Smoker    Packs/day: 0.50    Types: Cigarettes  . Smokeless tobacco: Never Used  Substance Use Topics  . Alcohol use: Yes    Comment: admitted to use on 01/06/16  . Drug use: Yes    Types: IV    Comment: admitted to use on 01/06/16     Allergies   Penicillins   Review of Systems Review of Systems  Gastrointestinal: Positive for vomiting.  All other systems reviewed and are negative.    Physical Exam Updated Vital Signs BP (!) 131/95   Pulse (!) 118   Temp 98.8 F (37.1 C) (Oral)   Resp (!) 25   Ht 5\' 10"  (1.778 m)   Wt 72.6 kg   SpO2 100%   BMI 22.96 kg/m   Physical Exam Vitals signs and nursing note reviewed.  Constitutional:      General: He is not in acute distress.    Appearance: He is well-developed.  HENT:     Head: Normocephalic and atraumatic.  Eyes:     Conjunctiva/sclera: Conjunctivae normal.     Pupils: Pupils are equal, round, and reactive to light.  Neck:     Musculoskeletal: Normal range of motion and neck supple.  Cardiovascular:     Rate and Rhythm: Regular rhythm. Tachycardia present.     Heart sounds: Normal heart sounds.  Pulmonary:     Effort: Pulmonary effort is normal. No respiratory distress.     Breath sounds: Normal breath sounds.  Abdominal:     General: There is no distension.     Palpations: Abdomen is soft.     Tenderness: There is no abdominal tenderness.  Musculoskeletal: Normal range of motion.        General: No deformity.  Skin:    General: Skin is warm and dry.  Neurological:     Mental Status: He is  alert and oriented to person, place, and time.      ED Treatments / Results  Labs (all labs ordered are listed, but only abnormal results are displayed) Labs Reviewed  CBC WITH DIFFERENTIAL/PLATELET - Abnormal; Notable for the following components:      Result Value   WBC 28.7 (*)    RBC 5.89 (*)    Neutro Abs 24.8 (*)    Monocytes Absolute 1.8 (*)    Abs Immature Granulocytes 0.32 (*)    All other components within normal limits  CBG MONITORING, ED - Abnormal; Notable for the following components:   Glucose-Capillary 566 (*)    All other components within normal limits  SARS CORONAVIRUS 2 (TAT 6-24 HRS)  PROTIME-INR  COMPREHENSIVE METABOLIC PANEL  ETHANOL  LIPASE, BLOOD  POC OCCULT BLOOD, ED  TYPE AND SCREEN    EKG None  Radiology No results found.  Procedures Procedures (including critical care time)  Medications Ordered in ED Medications  sodium chloride 0.9 % bolus 1,000 mL (1,000 mLs Intravenous New Bag/Given 02/15/19 2143)  ondansetron (ZOFRAN) injection 4 mg (4 mg Intravenous Given 02/15/19 2143)     Initial Impression / Assessment and Plan / ED Course  I have reviewed the triage vital signs and the nursing notes.  Pertinent labs & imaging results that were available during my care of the patient were reviewed by me and considered in my medical decision making (see chart for details).        MDM  Screen complete  [name] was evaluated in Emergency Department on 02/15/2019 for the symptoms described in the history of present illness. He was evaluated in the context of the global COVID-19 pandemic, which necessitated consideration that the patient might be at risk for infection with the SARS-CoV-2 virus that causes COVID-19. Institutional protocols and algorithms that pertain to the evaluation of patients at risk for COVID-19 are in a state of rapid change based on information released by regulatory bodies including the CDC and federal and state  organizations. These policies and algorithms were followed during the patient's care in the ED.  Patient is presenting for evaluation of persistent nausea and vomiting.  Patient is insulin-dependent diabetic with elevated blood glucose.  Patient is at high risk for likely DKA.  Screen labs will be obtained.  IV fluid resuscitation initiated.  Pending labs and disposition signed out to Dr. Kathrynn Humble.   Final Clinical Impressions(s) / ED Diagnoses   Final diagnoses:  Nausea and vomiting, intractability of vomiting not specified, unspecified vomiting type    ED Discharge Orders    None       Maryclaire Stoecker,  Noralyn PickPeter C, MD 02/15/19 [phone]

## 2019-02-16 ENCOUNTER — Encounter (HOSPITAL_COMMUNITY): Payer: Self-pay | Admitting: Internal Medicine

## 2019-02-16 ENCOUNTER — Emergency Department (HOSPITAL_COMMUNITY): Payer: Self-pay

## 2019-02-16 DIAGNOSIS — R109 Unspecified abdominal pain: Secondary | ICD-10-CM | POA: Diagnosis present

## 2019-02-16 DIAGNOSIS — R112 Nausea with vomiting, unspecified: Secondary | ICD-10-CM

## 2019-02-16 DIAGNOSIS — K920 Hematemesis: Secondary | ICD-10-CM

## 2019-02-16 DIAGNOSIS — R945 Abnormal results of liver function studies: Secondary | ICD-10-CM

## 2019-02-16 DIAGNOSIS — E1010 Type 1 diabetes mellitus with ketoacidosis without coma: Principal | ICD-10-CM

## 2019-02-16 DIAGNOSIS — F1910 Other psychoactive substance abuse, uncomplicated: Secondary | ICD-10-CM

## 2019-02-16 DIAGNOSIS — E1310 Other specified diabetes mellitus with ketoacidosis without coma: Secondary | ICD-10-CM

## 2019-02-16 DIAGNOSIS — R7989 Other specified abnormal findings of blood chemistry: Secondary | ICD-10-CM

## 2019-02-16 DIAGNOSIS — Z720 Tobacco use: Secondary | ICD-10-CM

## 2019-02-16 DIAGNOSIS — E1110 Type 2 diabetes mellitus with ketoacidosis without coma: Secondary | ICD-10-CM | POA: Diagnosis present

## 2019-02-16 DIAGNOSIS — R1011 Right upper quadrant pain: Secondary | ICD-10-CM

## 2019-02-16 DIAGNOSIS — D72829 Elevated white blood cell count, unspecified: Secondary | ICD-10-CM

## 2019-02-16 LAB — BASIC METABOLIC PANEL
Anion gap: 17 — ABNORMAL HIGH (ref 5–15)
Anion gap: 17 — ABNORMAL HIGH (ref 5–15)
Anion gap: 9 (ref 5–15)
Anion gap: 13 (ref 5–15)
BUN: 27 mg/dL — ABNORMAL HIGH (ref 6–20)
BUN: 27 mg/dL — ABNORMAL HIGH (ref 6–20)
BUN: 24 mg/dL — ABNORMAL HIGH (ref 6–20)
BUN: 20 mg/dL (ref 6–20)
CO2: 24 mmol/L (ref 22–32)
CO2: 24 mmol/L (ref 22–32)
CO2: 26 mmol/L (ref 22–32)
CO2: 23 mmol/L (ref 22–32)
Calcium: 9.5 mg/dL (ref 8.9–10.3)
Calcium: 9.3 mg/dL (ref 8.9–10.3)
Calcium: 8.7 mg/dL — ABNORMAL LOW (ref 8.9–10.3)
Calcium: 8.6 mg/dL — ABNORMAL LOW (ref 8.9–10.3)
Chloride: 98 mmol/L (ref 98–111)
Chloride: 100 mmol/L (ref 98–111)
Chloride: 103 mmol/L (ref 98–111)
Chloride: 98 mmol/L (ref 98–111)
Creatinine, Ser: 1.01 mg/dL (ref 0.61–1.24)
Creatinine, Ser: 1 mg/dL (ref 0.61–1.24)
Creatinine, Ser: 0.69 mg/dL (ref 0.61–1.24)
Creatinine, Ser: 0.88 mg/dL (ref 0.61–1.24)
GFR calc Af Amer: >60 mL/min (ref >60)
GFR calc Af Amer: >60 mL/min (ref >60)
GFR calc Af Amer: >60 mL/min (ref >60)
GFR calc Af Amer: >60 mL/min (ref >60)
GFR calc non Af Amer: >60 mL/min (ref >60)
GFR calc non Af Amer: >60 mL/min (ref >60)
GFR calc non Af Amer: >60 mL/min (ref >60)
GFR calc non Af Amer: >60 mL/min (ref >60)
Glucose, Bld: 283 mg/dL — ABNORMAL HIGH (ref 70–99)
Glucose, Bld: 285 mg/dL — ABNORMAL HIGH (ref 70–99)
Glucose, Bld: 152 mg/dL — ABNORMAL HIGH (ref 70–99)
Glucose, Bld: 194 mg/dL — ABNORMAL HIGH (ref 70–99)
Potassium: 3.8 mmol/L (ref 3.5–5.1)
Potassium: 3.7 mmol/L (ref 3.5–5.1)
Potassium: 3.2 mmol/L — ABNORMAL LOW (ref 3.5–5.1)
Potassium: 3.4 mmol/L — ABNORMAL LOW (ref 3.5–5.1)
Sodium: 139 mmol/L (ref 135–145)
Sodium: 141 mmol/L (ref 135–145)
Sodium: 138 mmol/L (ref 135–145)
Sodium: 134 mmol/L — ABNORMAL LOW (ref 135–145)

## 2019-02-16 LAB — URINALYSIS, ROUTINE W REFLEX MICROSCOPIC
Bacteria, UA: NONE SEEN
Bilirubin Urine: NEGATIVE
Glucose, UA: >=500 mg/dL — AB
Hgb urine dipstick: NEGATIVE
Ketones, ur: 80 mg/dL — AB
Leukocytes,Ua: NEGATIVE
Nitrite: NEGATIVE
Protein, ur: NEGATIVE mg/dL
Specific Gravity, Urine: 1.032 — ABNORMAL HIGH (ref 1.005–1.030)
pH: 6 (ref 5.0–8.0)

## 2019-02-16 LAB — CBG MONITORING, ED
Glucose-Capillary: 511 mg/dL (ref 70–99)
Glucose-Capillary: 487 mg/dL — ABNORMAL HIGH (ref 70–99)
Glucose-Capillary: 411 mg/dL — ABNORMAL HIGH (ref 70–99)
Glucose-Capillary: 312 mg/dL — ABNORMAL HIGH (ref 70–99)

## 2019-02-16 LAB — RAPID URINE DRUG SCREEN, HOSP PERFORMED
Amphetamines: POSITIVE — AB
Barbiturates: NOT DETECTED
Benzodiazepines: NOT DETECTED
Cocaine: NOT DETECTED
Opiates: NOT DETECTED
Tetrahydrocannabinol: NOT DETECTED

## 2019-02-16 LAB — HEPATITIS PANEL, ACUTE
HCV Ab: REACTIVE — AB
Hep A IgM: NONREACTIVE
Hep B C IgM: NONREACTIVE
Hepatitis B Surface Ag: NONREACTIVE

## 2019-02-16 LAB — COMPREHENSIVE METABOLIC PANEL
ALT: 50 U/L — ABNORMAL HIGH (ref 0–44)
AST: 42 U/L — ABNORMAL HIGH (ref 15–41)
Albumin: 3.9 g/dL (ref 3.5–5.0)
Alkaline Phosphatase: 178 U/L — ABNORMAL HIGH (ref 38–126)
Anion gap: 22 — ABNORMAL HIGH (ref 5–15)
BUN: 27 mg/dL — ABNORMAL HIGH (ref 6–20)
CO2: 25 mmol/L (ref 22–32)
Calcium: 9.5 mg/dL (ref 8.9–10.3)
Chloride: 85 mmol/L — ABNORMAL LOW (ref 98–111)
Creatinine, Ser: 0.99 mg/dL (ref 0.61–1.24)
GFR calc Af Amer: >60 mL/min (ref >60)
GFR calc non Af Amer: >60 mL/min (ref >60)
Glucose, Bld: 537 mg/dL (ref 70–99)
Potassium: 4.2 mmol/L (ref 3.5–5.1)
Sodium: 132 mmol/L — ABNORMAL LOW (ref 135–145)
Total Bilirubin: 3 mg/dL — ABNORMAL HIGH (ref 0.3–1.2)
Total Protein: 7.6 g/dL (ref 6.5–8.1)

## 2019-02-16 LAB — TYPE AND SCREEN
ABO/RH(D): O NEG
Antibody Screen: NEGATIVE

## 2019-02-16 LAB — GLUCOSE, CAPILLARY
Glucose-Capillary: 295 mg/dL — ABNORMAL HIGH (ref 70–99)
Glucose-Capillary: 201 mg/dL — ABNORMAL HIGH (ref 70–99)
Glucose-Capillary: 212 mg/dL — ABNORMAL HIGH (ref 70–99)
Glucose-Capillary: 173 mg/dL — ABNORMAL HIGH (ref 70–99)
Glucose-Capillary: 150 mg/dL — ABNORMAL HIGH (ref 70–99)
Glucose-Capillary: 131 mg/dL — ABNORMAL HIGH (ref 70–99)
Glucose-Capillary: 137 mg/dL — ABNORMAL HIGH (ref 70–99)
Glucose-Capillary: 223 mg/dL — ABNORMAL HIGH (ref 70–99)

## 2019-02-16 LAB — HEMOGLOBIN A1C
Hgb A1c MFr Bld: 13.8 % — ABNORMAL HIGH (ref 4.8–5.6)
Mean Plasma Glucose: 349.36 mg/dL

## 2019-02-16 LAB — ABO/RH: ABO/RH(D): O NEG

## 2019-02-16 LAB — HIV ANTIBODY (ROUTINE TESTING W REFLEX): HIV Screen 4th Generation wRfx: NONREACTIVE

## 2019-02-16 LAB — MRSA PCR SCREENING: MRSA by PCR: NEGATIVE

## 2019-02-16 LAB — SARS CORONAVIRUS 2 (TAT 6-24 HRS): SARS Coronavirus 2: NEGATIVE

## 2019-02-16 LAB — ETHANOL: Alcohol, Ethyl (B): <10 mg/dL (ref <10)

## 2019-02-16 MED ORDER — INSULIN REGULAR(HUMAN) IN NACL 100-0.9 UT/100ML-% IV SOLN
INTRAVENOUS | Status: DC
  Administered 2019-02-16: 2.3 [IU]/h via INTRAVENOUS
  Administered 2019-02-16: 4.5 [IU]/h via INTRAVENOUS
  Filled 2019-02-16 (×2): qty 100

## 2019-02-16 MED ORDER — LIVING WELL WITH DIABETES BOOK
Freq: Once | Status: AC
  Administered 2019-02-16: 1
  Filled 2019-02-16: qty 1

## 2019-02-16 MED ORDER — INSULIN ASPART 100 UNIT/ML ~~LOC~~ SOLN
0.0000 [IU] | Freq: Every day | SUBCUTANEOUS | Status: DC
  Administered 2019-02-16: 22:00:00 4 [IU] via SUBCUTANEOUS

## 2019-02-16 MED ORDER — ONDANSETRON HCL 4 MG/2ML IJ SOLN
4.0000 mg | Freq: Once | INTRAMUSCULAR | Status: AC
  Administered 2019-02-16: 4 mg via INTRAVENOUS
  Filled 2019-02-16: qty 2

## 2019-02-16 MED ORDER — POTASSIUM CHLORIDE 10 MEQ/100ML IV SOLN
10.0000 meq | INTRAVENOUS | Status: AC
  Administered 2019-02-16 (×2): 10 meq via INTRAVENOUS
  Filled 2019-02-16 (×2): qty 100

## 2019-02-16 MED ORDER — LABETALOL HCL 5 MG/ML IV SOLN
20.0000 mg | INTRAVENOUS | Status: DC | PRN
  Administered 2019-02-16: 10:00:00 20 mg via INTRAVENOUS
  Filled 2019-02-16 (×2): qty 4

## 2019-02-16 MED ORDER — SODIUM CHLORIDE 0.9 % IV SOLN: INTRAVENOUS | Status: DC

## 2019-02-16 MED ORDER — METOCLOPRAMIDE HCL 5 MG/ML IJ SOLN
5.0000 mg | Freq: Three times a day (TID) | INTRAMUSCULAR | Status: DC
  Administered 2019-02-16 (×3): 5 mg via INTRAVENOUS
  Filled 2019-02-16 (×3): qty 2

## 2019-02-16 MED ORDER — DEXTROSE-NACL 5-0.45 % IV SOLN: INTRAVENOUS | Status: DC

## 2019-02-16 MED ORDER — SODIUM CHLORIDE 0.9 % IV SOLN
INTRAVENOUS | Status: DC
  Administered 2019-02-16 (×2): via INTRAVENOUS

## 2019-02-16 MED ORDER — CHLORHEXIDINE GLUCONATE CLOTH 2 % EX PADS
6.0000 | MEDICATED_PAD | Freq: Every day | CUTANEOUS | Status: DC
  Administered 2019-02-16 – 2019-02-17 (×2): 6 via TOPICAL

## 2019-02-16 MED ORDER — MORPHINE SULFATE (PF) 2 MG/ML IV SOLN
2.0000 mg | INTRAVENOUS | Status: DC | PRN
  Administered 2019-02-16 – 2019-02-17 (×3): 2 mg via INTRAVENOUS
  Filled 2019-02-16 (×3): qty 1

## 2019-02-16 MED ORDER — PANTOPRAZOLE SODIUM 40 MG IV SOLR
40.0000 mg | Freq: Two times a day (BID) | INTRAVENOUS | Status: DC
  Administered 2019-02-16 (×3): 40 mg via INTRAVENOUS
  Filled 2019-02-16 (×3): qty 40

## 2019-02-16 MED ORDER — INSULIN ASPART 100 UNIT/ML ~~LOC~~ SOLN
0.0000 [IU] | Freq: Three times a day (TID) | SUBCUTANEOUS | Status: DC
  Administered 2019-02-17: 3 [IU] via SUBCUTANEOUS

## 2019-02-16 MED ORDER — NICOTINE 21 MG/24HR TD PT24
21.0000 mg | MEDICATED_PATCH | Freq: Every day | TRANSDERMAL | Status: DC
  Administered 2019-02-16 – 2019-02-17 (×2): 21 mg via TRANSDERMAL
  Filled 2019-02-16 (×2): qty 1

## 2019-02-16 MED ORDER — DEXTROSE-NACL 5-0.45 % IV SOLN
INTRAVENOUS | Status: DC
  Administered 2019-02-16: 08:00:00 via INTRAVENOUS

## 2019-02-16 MED ORDER — ONDANSETRON HCL 4 MG/2ML IJ SOLN: 4.0000 mg | Freq: Three times a day (TID) | INTRAMUSCULAR | Status: DC | PRN

## 2019-02-16 MED ORDER — POTASSIUM CHLORIDE CRYS ER 20 MEQ PO TBCR
40.0000 meq | EXTENDED_RELEASE_TABLET | ORAL | Status: AC
  Administered 2019-02-16 (×2): 40 meq via ORAL
  Filled 2019-02-16 (×2): qty 2

## 2019-02-16 MED ORDER — INFLUENZA VAC SPLIT QUAD 0.5 ML IM SUSY
0.5000 mL | PREFILLED_SYRINGE | INTRAMUSCULAR | Status: AC
  Administered 2019-02-17: 0.5 mL via INTRAMUSCULAR
  Filled 2019-02-16: qty 0.5

## 2019-02-16 MED ORDER — INSULIN NPH (HUMAN) (ISOPHANE) 100 UNIT/ML ~~LOC~~ SUSP
20.0000 [IU] | Freq: Two times a day (BID) | SUBCUTANEOUS | Status: DC
  Administered 2019-02-16: 20 [IU] via SUBCUTANEOUS
  Filled 2019-02-16: qty 10

## 2019-02-16 MED ORDER — LISINOPRIL 10 MG PO TABS
10.0000 mg | ORAL_TABLET | Freq: Every day | ORAL | Status: DC
  Administered 2019-02-16: 11:00:00 10 mg via ORAL
  Filled 2019-02-16: qty 1

## 2019-02-16 MED ORDER — LACTATED RINGERS IV SOLN: INTRAVENOUS | Status: DC

## 2019-02-16 MED ORDER — ZOLPIDEM TARTRATE 5 MG PO TABS
5.0000 mg | ORAL_TABLET | Freq: Every evening | ORAL | Status: DC | PRN
  Administered 2019-02-16: 23:00:00 5 mg via ORAL
  Filled 2019-02-16: qty 1

## 2019-02-16 NOTE — H&P (Signed)
History and Physical    NISHAN OVENS WUJ:[id number] DOB: 21-Apr-1976 DOA: 02/15/2019  Referring MD/NP/PA:   PCP: Patient, No Pcp Per   Patient coming from:  The patient is coming from home.  At baseline, pt is independent for most of ADL.        Chief Complaint: Intractable nausea, vomiting, abdominal pain, coffee-ground emesis  HPI: Ryan Richard is a 43 y.o. male with medical history significant of type 1 diabetes, chronic back pain, polysubstance abuse, tobacco abuse, who presents with intractable nausea, vomiting, abdominal pain, coffee-ground emesis.  Patient states that he has been having nausea, vomiting abdominal pain for more than 2 days.  He has vomited coffee-ground materials more than 12 times today.  Abdominal pain is located in right upper quadrant, constant, initially 10 out of 10 severity, currently 5 out of 10 severity, sharp, nonradiating.  No diarrhea.  No fever or chills.  Patient does not have chest pain, shortness of breath, cough.  No symptoms of UTI or unilateral weakness.  ED Course: pt was found to have WBC 28.7, pending COVID-19 test, negative FOBT, blood sugar 537, bicarbonate 25, anion gap 22, abnormal liver function (ALP 178, AST 42, ALT 50, total protein 3.0), alcohol level less than 10, lipase 23, INR 0.9, urinalysis negative for UTI, but positive for ketone 80, electrolytes renal function okay, temperature normal, blood pressure 182/126, 154/92, tachycardia, oxygen saturation 96% on room air.  RUQ- ultrasound negative.  Patient is admitted to stepdown as inpatient.  Review of Systems:   General: no fevers, chills, no body weight gain, has poor appetite, has fatigue HEENT: no blurry vision, hearing changes or sore throat Respiratory: no dyspnea, coughing, wheezing CV: no chest pain, no palpitations GI: has nausea, vomiting, abdominal pain, no diarrhea, constipation GU: no dysuria, burning on urination, increased urinary frequency, hematuria  Ext: no leg edema  Neuro: no unilateral weakness, numbness, or tingling, no vision change or hearing loss Skin: no rash, no skin tear. MSK: No muscle spasm, no deformity, no limitation of range of movement in spin Heme: No easy bruising.  Travel history: No recent long distant travel.  Allergy:  Allergies  Allergen Reactions  . Penicillins Hives    Past Medical History:  Diagnosis Date  . Chronic back pain   . Diabetes mellitus without complication [facility])     Past Surgical History:  Procedure Laterality Date  . HERNIA REPAIR    . open heart surgery- valve      Social History:  reports that he has been smoking cigarettes. He has been smoking about 0.50 packs per day. He has never used smokeless tobacco. He reports current alcohol use. He reports current drug use. Drug: IV.  Family History:  Family History  Problem Relation Age of Onset  . Diabetes Mellitus II Maternal Grandmother   . Diabetes Mellitus II Paternal Grandmother      Prior to Admission medications   Medication Sig Start Date End Date Taking? Authorizing Provider  insulin detemir (LEVEMIR) 100 UNIT/ML injection Inject 45 Units into the skin every evening.   Yes [provider]  insulin glargine (LANTUS) 100 UNIT/ML injection Inject 45 Units into the skin every evening.    Yes [provider]  insulin lispro (HUMALOG) 100 UNIT/ML injection Inject 3-18 Units into the skin 3 (three) times daily before meals. Sliding scale   Yes [provider]  naproxen (NAPROSYN) 500 MG tablet Take 1 tablet (500 mg total) by mouth 2 (two) times daily.  Patient not taking: Reported on 02/15/2019 01/07/16   Varney Biles, MD  ondansetron (ZOFRAN ODT) 4 MG disintegrating tablet Take 1 tablet (4 mg total) by mouth every 8 (eight) hours as needed for nausea. Patient not taking: Reported on 02/15/2019 05/25/14   Tanna Furry, MD    Physical Exam: Vitals:   02/16/19 0430 02/16/19 0500 02/16/19 0530 02/16/19 0600  BP: (!) 152/116  (!) 148/93 (!) 149/111 (!) 150/116  Pulse: (!) 121 (!) 113 (!) 125 (!) 113  Resp: 20 19 (!) 21 (!) 21  Temp:      TempSrc:      SpO2: 98% 98% 100% 94%  Weight:      Height:       General: Not in acute distress HEENT:       Eyes: PERRL, EOMI, no scleral icterus.       ENT: No discharge from the ears and nose, no pharynx injection, no tonsillar enlargement.        Neck: No JVD, no bruit, no mass felt. Heme: No neck lymph node enlargement. Cardiac: S1/S2, RRR, No murmurs, No gallops or rubs. Respiratory:  No rales, wheezing, rhonchi or rubs. GI: Soft, nondistended, has tenderness in RUQ, no rebound pain, no organomegaly, BS present. GU: No hematuria Ext: No pitting leg edema bilaterally. 2+DP/PT pulse bilaterally. Musculoskeletal: No joint deformities, No joint redness or warmth, no limitation of ROM in spin. Skin: No rashes.  Neuro: Alert, oriented X3, cranial nerves II-XII grossly intact, moves all extremities normally. Psych: Patient is not psychotic, no suicidal or hemocidal ideation.  Labs on Admission: I have personally reviewed following labs and imaging studies  CBC: Recent Labs  Lab 02/15/19 2141  WBC 28.7*  NEUTROABS 24.8*  HGB 16.7  HCT 48.0  MCV 81.5  PLT 742   Basic Metabolic Panel: Recent Labs  Lab 02/15/19 2141  NA 132*  K 4.2  CL 85*  CO2 25  GLUCOSE 537*  BUN 27*  CREATININE 0.99  CALCIUM 9.5   GFR: Estimated Creatinine Clearance: 100.8 mL/min (by C-G formula based on SCr of 0.99 mg/dL). Liver Function Tests: Recent Labs  Lab 02/15/19 2141  AST 42*  ALT 50*  ALKPHOS 178*  BILITOT 3.0*  PROT 7.6  ALBUMIN 3.9   Recent Labs  Lab 02/15/19 2141  LIPASE 23   No results for input(s): AMMONIA in the last 168 hours. Coagulation Profile: Recent Labs  Lab 02/15/19 2141  INR 0.9   Cardiac Enzymes: No results for input(s): CKTOTAL, CKMB, CKMBINDEX, TROPONINI in the last 168 hours. BNP (last 3 results) No results for input(s): PROBNP in  the last 8760 hours. HbA1C: No results for input(s): HGBA1C in the last 72 hours. CBG: Recent Labs  Lab 02/16/19 0232 02/16/19 0345 02/16/19 0452 02/16/19 0558 02/16/19 0700  GLUCAP 511* 487* 411* 312* 295*   Lipid Profile: No results for input(s): CHOL, HDL, LDLCALC, TRIG, CHOLHDL, LDLDIRECT in the last 72 hours. Thyroid Function Tests: No results for input(s): TSH, T4TOTAL, FREET4, T3FREE, THYROIDAB in the last 72 hours. Anemia Panel: No results for input(s): VITAMINB12, FOLATE, FERRITIN, TIBC, IRON, RETICCTPCT in the last 72 hours. Urine analysis:    Component Value Date/Time   COLORURINE STRAW (A) 02/16/2019 0053   APPEARANCEUR CLEAR 02/16/2019 0053   LABSPEC 1.032 (H) 02/16/2019 0053   PHURINE 6.0 02/16/2019 0053   GLUCOSEU >=500 (A) 02/16/2019 0053   HGBUR NEGATIVE 02/16/2019 0053   HGBUR negative 12/25/2007 [place] 02/16/2019 0053  KETONESUR 80 (A) 02/16/2019 0053   PROTEINUR NEGATIVE 02/16/2019 0053   UROBILINOGEN 0.2 05/25/2014 1026   NITRITE NEGATIVE 02/16/2019 0053   LEUKOCYTESUR NEGATIVE 02/16/2019 0053   Sepsis Labs: @LABRCNTIP (procalcitonin:4,lacticidven:4) ) Recent Results (from the past 240 hour(s))  SARS CORONAVIRUS 2 (TAT 6-24 HRS) Nasopharyngeal Nasopharyngeal Swab     Status: None   Collection Time: 02/15/19  9:48 PM   Specimen: Nasopharyngeal Swab  Result Value Ref Range Status   SARS Coronavirus 2 NEGATIVE NEGATIVE Final    Comment: (NOTE) SARS-CoV-2 target nucleic acids are NOT DETECTED. The SARS-CoV-2 RNA is generally detectable in upper and lower respiratory specimens during the acute phase of infection. Negative results do not preclude SARS-CoV-2 infection, do not rule out co-infections with other pathogens, and should not be used as the sole basis for treatment or other patient management decisions. Negative results must be combined with clinical observations, patient history, and epidemiological information. The  expected result is Negative. Fact Sheet for Patients: HairSlick.nohttps://www.fda.gov/media/138098/download Fact Sheet for Healthcare Providers: quierodirigir.comhttps://www.fda.gov/media/138095/download This test is not yet approved or cleared by the [place] FDA and  has been authorized for detection and/or diagnosis of SARS-CoV-2 by FDA under an Emergency Use Authorization (EUA). This EUA will remain  in effect (meaning this test can be used) for the duration of the COVID-19 declaration under Section 56 4(b)(1) of the Act, 21 U.S.C. section 360bbb-3(b)(1), unless the authorization is terminated or revoked sooner. Performed at [facility], [address] [place], [place] [address]      Radiological Exams on Admission: Koreas Abdomen Limited Ruq  Result Date: 02/16/2019 CLINICAL DATA:  Elevated LFTs EXAM: ULTRASOUND ABDOMEN LIMITED RIGHT UPPER QUADRANT COMPARISON:  None. FINDINGS: Gallbladder: No gallstones or wall thickening visualized. No sonographic Murphy sign noted by sonographer. Common bile duct: Diameter: 4 mm Liver: No focal lesion identified. Within normal limits in parenchymal echogenicity. Portal vein is patent on color Doppler imaging with normal direction of blood flow towards the liver. Other: None. IMPRESSION: Normal liver and gallbladder. Electronically Signed   By: Jonna ClarkBindu  Avutu M.D.   On: 02/16/2019 01:39     EKG:  Not done in ED, will get one.   Assessment/Plan Principal Problem:   DKA (diabetic ketoacidoses) (HCC) Active Problems:   Type 1 diabetes mellitus (HCC)   Tobacco abuse   Coffee ground emesis   Abnormal liver function   Abdominal pain   Leukocytosis   Polysubstance abuse (HCC)   Intractable nausea and vomiting   DKA (diabetic ketoacidoses) (HCC): - Admit to stepdown as inpt - 3L of NS bolus - start DKA protocol with BMP q4h - IVF: NS 125 cc/h; will switch to D5-1/2NS when CBG<250 - replete K as needed  - Zofran prn nausea  - NPO  - consult to diabetic  educator and case manager  Type 1 diabetes mellitus (HCC): Last A1c 7.4, as type I DM, controled OK. Patient is taking Lantus NovoLog at home.  Now has DKA -on DKA protocol  Polysubstance abuse and Tobacco abuse: -Did counseling about importance of quitting substance use - Check UDS - Nicotine patch  Intractable nausea, vomiting and abdominal pain: Etiology is not clear.  Patient has a mild abnormal liver function, but ultrasound is negative.  Potential differential diagnosis is gastroparesis. -Scheduled Reglan, PRN Zofran -IV fluid as above  Coffee ground emesis: Hemoglobin stable, 16.6. -Started IV Protonix 40 mg twice daily -Follow-up CBC for hemoglobin level  Abnormal liver function: Mild abnormal liver function with AST  42, ALT 50, total bilirubin 3.0, ALP 178.  Abdominal ultrasound is negative. -Check hepatitis panel, HIV antibody.  Leukocytosis: No fever, no signs of infection.  Possibly due to DKA - blood culture x 2    Inpatient status:  # Patient requires inpatient status due to high intensity of service, high risk for further deterioration and high frequency of surveillance required.  I certify that at the point of admission it is my clinical judgment that the patient will require inpatient hospital care spanning beyond 2 midnights from the point of admission.  . This patient has multiple chronic comorbidities including type 1 diabetes, chronic back pain, polysubstance abuse, tobacco abuse . Now patient has presenting symptoms include intractable nausea, vomiting, abdominal pain, coffee-ground emesis, DKA . The worrisome physical exam findings include tenderness in RUQ . The initial radiographic and laboratory data are worrisome because of leukocytosis, urinalysis positive for ketone, elevated blood sugar with DKA . Current medical needs: please see my assessment and plan . Predictability of an adverse outcome (risk): Patient has multiple comorbidities, now presents with  intractable nausea, vomiting, abdominal pain, coffee-ground emesis, and DKA.  His presentation is highly complicated.  Will need to be treated in hospital for at least 2 days due to high risk of deteriorating.       DVT ppx: SCD Code Status: Full code Family Communication: None at bed side.    Disposition Plan:  Anticipate discharge back to previous home environment Consults called:  none Admission status:   SDU/inpation       Date of Service 02/16/2019    Lorretta Harp [facility]   If 7PM-7AM, please contact night-coverage [email or web address] Password [email or web address] 02/16/2019, 7:09 AM

## 2019-02-16 NOTE — Progress Notes (Signed)
Patients fiance voicing concerns over patients ability to afford insulin medications.  Pt. Is currently unemployed with no PCP.  Social work consult place via MD.  Fiance requesting information regarding diabetes education.  RN placed order for diabetic information packet.

## 2019-02-16 NOTE — Progress Notes (Addendum)
PROGRESS NOTE    BRAELON SPRUNG  RXV:[id number] DOB: Jun 28, 1975 DOA: 02/15/2019 PCP: Patient, No Pcp Per    Brief Narrative:   Ryan Richard is a 43 y.o. male with medical history significant of type 1 diabetes, chronic back pain, polysubstance abuse, tobacco abuse, who presents with intractable nausea, vomiting, abdominal pain, coffee-ground emesis.  Patient states that he has been having nausea, vomiting abdominal pain for more than 2 days.  He has vomited coffee-ground materials more than 12 times today.  Abdominal pain is located in right upper quadrant, constant, initially 10 out of 10 severity, currently 5 out of 10 severity, sharp, nonradiating.  No diarrhea.  No fever or chills.  Patient does not have chest pain, shortness of breath, cough.  No symptoms of UTI or unilateral weakness.  ED Course: pt was found to have WBC 28.7, pending COVID-19 test, negative FOBT, blood sugar 537, bicarbonate 25, anion gap 22, abnormal liver function (ALP 178, AST 42, ALT 50, total protein 3.0), alcohol level less than 10, lipase 23, INR 0.9, urinalysis negative for UTI, but positive for ketone 80, electrolytes renal function okay, temperature normal, blood pressure 182/126, 154/92, tachycardia, oxygen saturation 96% on room air.  RUQ ultrasound negative.  Patient is admitted to stepdown as inpatient.   Assessment & Plan:   Principal Problem:   DKA (diabetic ketoacidoses) ([place]) Active Problems:   Type 1 diabetes mellitus (HCC)   Tobacco abuse   Coffee ground emesis   Abnormal liver function   Abdominal pain   Leukocytosis   Polysubstance abuse (HCC)   Intractable nausea and vomiting   DKA (diabetic ketoacidoses): Type 1 diabetes mellitus Patient presenting to ED with intractable nausea and vomiting.  Found to have elevated blood glucose of 537 with an anion gap of 22 and 80 ketones in his urine.  Patient reports recently moved from [place] and has been out of his home insulin.  He  reports his home insulin regimen is Levemir 40-45 units subcutaneously daily with a NovoLog insulin sliding scale.  Hemoglobin A1c 13.8, compatible with extremely poorly controlled diabetes with previous hemoglobin A1c 7.4 on 12/05/2007. --AG 22-->17 --Continue DKA protocol with insulin drip until AG closed (12 or less) x 2, then will transition to long acting insulin --BMP every 4 hours --CBG's q1h --IVF: NS 125 cc/h; will switch to D5-1/2NS when CBG<250 --Continue potassium replenishment as needed --Zofran prn nausea  --NPO, meds okay with sips of water/ice chips --consult to diabetic educator and case manager  Elevated blood pressure: Patient with no known history of hypertension.  Blood pressure elevated up to 190/120 this am --Start lisinopril 10 mg p.o. daily for renal protection in the setting of diabetes mellitus --Labetalol 20 mg IV prn --We will continue to monitor blood pressure closely, and titrate antihypertensives as necessary  Polysubstance abuse and Tobacco abuse: UDS positive for amphetamines.  Does not appear to be on any prescription medication. --Counseled on need for substance abuse and tobacco cessation --Nicotine patch  Intractable nausea, vomiting and abdominal pain:  Etiology likely secondary to DKA with gastroparesis as above.  Right upper quadrant ultrasound unrevealing.  --Scheduled Reglan, PRN Zofran --IV fluid as above  Coffee ground emesis:  Hemoglobin stable, 16.6. FOBT negative.  Likely from intractable nausea/vomiting from underlying DKA/gastroparesis as above. --Protonix 40 mg IV BID --follow CBC daily  Abnormal liver function:  Mild abnormal liver function with AST 42, ALT 50, total bilirubin 3.0, ALP 178.  Abdominal ultrasound is negative.  --  hepatitis panel and HIV antibody pending --repeat CMP in am  Leukocytosis:  WBC 28.7 on admission. No fever, no signs of infection.  Possibly due to DKA/reactive leukocytosis. --blood culture x 2  pending --repeat CBC in am  Hyponatremia Sodium 132 on admission, with elevated glucose 537 likely pseudohyponatremia.  Corrected for elevated glucose is 139. --Continue DKA protocol as above   DVT prophylaxis: SCDs Code Status: Full code Family Communication: None Disposition Plan: Continue inpatient, insulin drip, further depending on clinical course, currently lives with a roommate.   Consultants:   None  Procedures:   None  Antimicrobials:  None   Subjective: Patient seen and examined bedside, resting comfortably and sleeping but easily arousable.  States mouth is dry and request something to drink.  States nausea and vomiting have now subsided.  Feels less confused.  No other specific complaints at this time.  Denies headache, no fever/chills/night sweats, no nausea/vomit/diarrhea, no chest pain, no palpitations, no abdominal pain, no weakness, no cough/congestion, no paresthesias.  No acute events overnight per nursing staff.  Objective: Vitals:   02/16/19 0500 02/16/19 0530 02/16/19 0600 02/16/19 0800  BP: (!) 148/93 (!) 149/111 (!) 150/116   Pulse: (!) 113 (!) 125 (!) 113   Resp: 19 (!) 21 (!) 21   Temp:    99.2 F (37.3 C)  TempSrc:      SpO2: 98% 100% 94%   Weight:      Height:        Intake/Output Summary (Last 24 hours) at 02/16/2019 1004 Last data filed at 02/16/2019 1610 Gross per 24 hour  Intake 2100 ml  Output 1203 ml  Net 897 ml   Filed Weights   02/15/19 2111  Weight: 72.6 kg    Examination:  General exam: Appears calm and comfortable, appears older than stated age HEENT: Dry mucous membranes, PERRL, EOMI, NCAT Respiratory system: Clear to auscultation. Respiratory effort normal. Cardiovascular system: S1 & S2 heard, RRR. No JVD, murmurs, rubs, gallops or clicks. No pedal edema. Gastrointestinal system: Abdomen is nondistended, soft and nontender. No organomegaly or masses felt. Normal bowel sounds heard. Central nervous system: Alert and  oriented. No focal neurological deficits. Extremities: Symmetric 5 x 5 power. Skin: No rashes, lesions or ulcers Psychiatry: Judgement and insight appear normal. Mood & affect appropriate.     Data Reviewed: I have personally reviewed following labs and imaging studies  CBC: Recent Labs  Lab 02/15/19 2141  WBC 28.7*  NEUTROABS 24.8*  HGB 16.7  HCT 48.0  MCV 81.5  PLT 395   Basic Metabolic Panel: Recent Labs  Lab 02/15/19 2141 02/16/19 0808  NA 132* 141  K 4.2 3.7  CL 85* 100  CO2 25 24  GLUCOSE 537* 285*  BUN 27* 27*  CREATININE 0.99 1.00  CALCIUM 9.5 9.3   GFR: Estimated Creatinine Clearance: 99.8 mL/min (by C-G formula based on SCr of 1 mg/dL). Liver Function Tests: Recent Labs  Lab 02/15/19 2141  AST 42*  ALT 50*  ALKPHOS 178*  BILITOT 3.0*  PROT 7.6  ALBUMIN 3.9   Recent Labs  Lab 02/15/19 2141  LIPASE 23   No results for input(s): AMMONIA in the last 168 hours. Coagulation Profile: Recent Labs  Lab 02/15/19 2141  INR 0.9   Cardiac Enzymes: No results for input(s): CKTOTAL, CKMB, CKMBINDEX, TROPONINI in the last 168 hours. BNP (last 3 results) No results for input(s): PROBNP in the last 8760 hours. HbA1C: Recent Labs    02/16/19 [phone]  HGBA1C 13.8*   CBG: Recent Labs  Lab 02/16/19 0452 02/16/19 0558 02/16/19 0700 02/16/19 0818 02/16/19 0922  GLUCAP 411* 312* 295* 201* 212*   Lipid Profile: No results for input(s): CHOL, HDL, LDLCALC, TRIG, CHOLHDL, LDLDIRECT in the last 72 hours. Thyroid Function Tests: No results for input(s): TSH, T4TOTAL, FREET4, T3FREE, THYROIDAB in the last 72 hours. Anemia Panel: No results for input(s): VITAMINB12, FOLATE, FERRITIN, TIBC, IRON, RETICCTPCT in the last 72 hours. Sepsis Labs: No results for input(s): PROCALCITON, LATICACIDVEN in the last 168 hours.  Recent Results (from the past 240 hour(s))  SARS CORONAVIRUS 2 (TAT 6-24 HRS) Nasopharyngeal Nasopharyngeal Swab     Status: None    Collection Time: 02/15/19  9:48 PM   Specimen: Nasopharyngeal Swab  Result Value Ref Range Status   SARS Coronavirus 2 NEGATIVE NEGATIVE Final    Comment: (NOTE) SARS-CoV-2 target nucleic acids are NOT DETECTED. The SARS-CoV-2 RNA is generally detectable in upper and lower respiratory specimens during the acute phase of infection. Negative results do not preclude SARS-CoV-2 infection, do not rule out co-infections with other pathogens, and should not be used as the sole basis for treatment or other patient management decisions. Negative results must be combined with clinical observations, patient history, and epidemiological information. The expected result is Negative. Fact Sheet for Patients: HairSlick.nohttps://www.fda.gov/media/138098/download Fact Sheet for Healthcare Providers: quierodirigir.comhttps://www.fda.gov/media/138095/download This test is not yet approved or cleared by the [place] FDA and  has been authorized for detection and/or diagnosis of SARS-CoV-2 by FDA under an Emergency Use Authorization (EUA). This EUA will remain  in effect (meaning this test can be used) for the duration of the COVID-19 declaration under Section 56 4(b)(1) of the Act, 21 U.S.C. section 360bbb-3(b)(1), unless the authorization is terminated or revoked sooner. Performed at [facility], [address] [place], [place] [address]   MRSA PCR Screening     Status: None   Collection Time: 02/16/19  6:47 AM   Specimen: Nasopharyngeal Wash  Result Value Ref Range Status   MRSA by PCR NEGATIVE NEGATIVE Final    Comment:        The GeneXpert MRSA Assay (FDA approved for NASAL specimens only), is one component of a comprehensive MRSA colonization surveillance program. It is not intended to diagnose MRSA infection nor to guide or monitor treatment for MRSA infections. Performed at [facility], [address] [place], [place] [address]          Radiology Studies: Koreas Abdomen  Limited Ruq  Result Date: 02/16/2019 CLINICAL DATA:  Elevated LFTs EXAM: ULTRASOUND ABDOMEN LIMITED RIGHT UPPER QUADRANT COMPARISON:  None. FINDINGS: Gallbladder: No gallstones or wall thickening visualized. No sonographic Murphy sign noted by sonographer. Common bile duct: Diameter: 4 mm Liver: No focal lesion identified. Within normal limits in parenchymal echogenicity. Portal vein is patent on color Doppler imaging with normal direction of blood flow towards the liver. Other: None. IMPRESSION: Normal liver and gallbladder. Electronically Signed   By: Jonna ClarkBindu  Avutu M.D.   On: 02/16/2019 01:39        Scheduled Meds:  Chlorhexidine Gluconate Cloth  6 each Topical Daily   lisinopril  10 mg Oral Daily   metoCLOPramide (REGLAN) injection  5 mg Intravenous Q8H   nicotine  21 mg Transdermal Daily   pantoprazole (PROTONIX) IV  40 mg Intravenous Q12H   Continuous Infusions:  sodium chloride 125 mL/hr at 02/16/19 0628   dextrose 5 % and 0.45% NaCl 125 mL/hr at 02/16/19  6213   insulin 7 Units/hr (02/16/19 0454)     LOS: 0 days    Critical Care Time Upon my evaluation, this patient had a high probability of imminent or life-threatening deterioration due to diabetic ketoacidosis, which required my direct attention, intervention, and personal management.  I have personally provided 32 minutes of critical care time exclusive of my time spent on separately billable procedures.  Time includes review of laboratory data, radiology results, discussion with consultants, and monitoring for potential decompensation.       Alvira Philips [place], DO [facility] Pager [phone]  If 7PM-7AM, please contact night-coverage [email or web address] Password [email or web address] 02/16/2019, 10:04 AM

## 2019-02-17 LAB — COMPREHENSIVE METABOLIC PANEL
ALT: 33 U/L (ref 0–44)
AST: 23 U/L (ref 15–41)
Albumin: 2.8 g/dL — ABNORMAL LOW (ref 3.5–5.0)
Alkaline Phosphatase: 111 U/L (ref 38–126)
Anion gap: 8 (ref 5–15)
BUN: 11 mg/dL (ref 6–20)
CO2: 23 mmol/L (ref 22–32)
Calcium: 8.4 mg/dL — ABNORMAL LOW (ref 8.9–10.3)
Chloride: 102 mmol/L (ref 98–111)
Creatinine, Ser: 0.64 mg/dL (ref 0.61–1.24)
GFR calc Af Amer: >60 mL/min (ref >60)
GFR calc non Af Amer: >60 mL/min (ref >60)
Glucose, Bld: 209 mg/dL — ABNORMAL HIGH (ref 70–99)
Potassium: 3.4 mmol/L — ABNORMAL LOW (ref 3.5–5.1)
Sodium: 133 mmol/L — ABNORMAL LOW (ref 135–145)
Total Bilirubin: 1.3 mg/dL — ABNORMAL HIGH (ref 0.3–1.2)
Total Protein: 5.6 g/dL — ABNORMAL LOW (ref 6.5–8.1)

## 2019-02-17 LAB — MAGNESIUM: Magnesium: 2 mg/dL (ref 1.7–2.4)

## 2019-02-17 LAB — CBC
HCT: 38.8 % — ABNORMAL LOW (ref 39.0–52.0)
Hemoglobin: 13.2 g/dL (ref 13.0–17.0)
MCH: 28.6 pg (ref 26.0–34.0)
MCHC: 34 g/dL (ref 30.0–36.0)
MCV: 84 fL (ref 80.0–100.0)
Platelets: 276 10*3/uL (ref 150–400)
RBC: 4.62 MIL/uL (ref 4.22–5.81)
RDW: 13.2 % (ref 11.5–15.5)
WBC: 21.5 10*3/uL — ABNORMAL HIGH (ref 4.0–10.5)
nRBC: 0 % (ref 0.0–0.2)

## 2019-02-17 LAB — GLUCOSE, CAPILLARY
Glucose-Capillary: 223 mg/dL — ABNORMAL HIGH (ref 70–99)
Glucose-Capillary: 415 mg/dL — ABNORMAL HIGH (ref 70–99)
Glucose-Capillary: 260 mg/dL — ABNORMAL HIGH (ref 70–99)

## 2019-02-17 MED ORDER — OXYCODONE HCL 5 MG PO TABS
5.0000 mg | ORAL_TABLET | Freq: Four times a day (QID) | ORAL | Status: DC | PRN
  Administered 2019-02-17 (×2): 5 mg via ORAL
  Filled 2019-02-17 (×2): qty 1

## 2019-02-17 MED ORDER — INSULIN ASPART 100 UNIT/ML ~~LOC~~ SOLN: 0.0000 [IU] | Freq: Three times a day (TID) | SUBCUTANEOUS | Status: DC

## 2019-02-17 MED ORDER — POTASSIUM CHLORIDE CRYS ER 20 MEQ PO TBCR
30.0000 meq | EXTENDED_RELEASE_TABLET | ORAL | Status: AC
  Administered 2019-02-17 (×2): 30 meq via ORAL
  Filled 2019-02-17 (×2): qty 1

## 2019-02-17 MED ORDER — PANTOPRAZOLE SODIUM 40 MG PO TBEC
40.0000 mg | DELAYED_RELEASE_TABLET | Freq: Every day | ORAL | Status: DC
  Administered 2019-02-17: 40 mg via ORAL
  Filled 2019-02-17: qty 1

## 2019-02-17 MED ORDER — INSULIN NPH (HUMAN) (ISOPHANE) 100 UNIT/ML ~~LOC~~ SUSP: 30.0000 [IU] | Freq: Two times a day (BID) | SUBCUTANEOUS | Status: DC

## 2019-02-17 MED ORDER — INSULIN ASPART 100 UNIT/ML ~~LOC~~ SOLN: 0.0000 [IU] | Freq: Every day | SUBCUTANEOUS | Status: DC

## 2019-02-17 MED ORDER — INSULIN ASPART 100 UNIT/ML ~~LOC~~ SOLN
12.0000 [IU] | Freq: Once | SUBCUTANEOUS | Status: AC
  Administered 2019-02-17: 12 [IU] via SUBCUTANEOUS

## 2019-02-17 MED ORDER — LISINOPRIL 20 MG PO TABS
20.0000 mg | ORAL_TABLET | Freq: Every day | ORAL | Status: DC
  Administered 2019-02-17: 09:00:00 20 mg via ORAL
  Filled 2019-02-17: qty 2

## 2019-02-17 MED ORDER — INSULIN NPH (HUMAN) (ISOPHANE) 100 UNIT/ML ~~LOC~~ SUSP
24.0000 [IU] | Freq: Two times a day (BID) | SUBCUTANEOUS | Status: DC
  Administered 2019-02-17: 24 [IU] via SUBCUTANEOUS

## 2019-02-17 NOTE — Progress Notes (Signed)
Patient transferred from ICU. Vital signs stable. Alert and Oriented x4. Roommate at bedside. Patient requesting to take a shower, MD paged for an order. RN will continue to monitor the patient.

## 2019-02-17 NOTE — Progress Notes (Signed)
PROGRESS NOTE    Ryan Richard  RUE:[id number] DOB: June 30, 1975 DOA: 02/15/2019 PCP: Patient, No Pcp Per    Brief Narrative:   Ryan Richard is a 43 y.o. male with medical history significant of type 1 diabetes, chronic back pain, polysubstance abuse, tobacco abuse, who presents with intractable nausea, vomiting, abdominal pain, coffee-ground emesis.  Patient states that he has been having nausea, vomiting abdominal pain for more than 2 days.  He has vomited coffee-ground materials more than 12 times today.  Abdominal pain is located in right upper quadrant, constant, initially 10 out of 10 severity, currently 5 out of 10 severity, sharp, nonradiating.  No diarrhea.  No fever or chills.  Patient does not have chest pain, shortness of breath, cough.  No symptoms of UTI or unilateral weakness.  ED Course:  was found to have WBC 28.7, pending COVID-19 test, negative FOBT, blood sugar 537, bicarbonate 25, anion gap 22, abnormal liver function (ALP 178, AST 42, ALT 50, total protein 3.0), alcohol level less than 10, lipase 23, INR 0.9, urinalysis negative for UTI, but positive for ketone 80, electrolytes renal function okay, temperature normal, blood pressure 182/126, 154/92, tachycardia, oxygen saturation 96% on room air.  RUQ ultrasound negative.  Patient is admitted to stepdown as inpatient.   Assessment & Plan:   Principal Problem:   DKA (diabetic ketoacidoses) (HCC) Active Problems:   Type 1 diabetes mellitus (HCC)   Tobacco abuse   Coffee ground emesis   Abnormal liver function   Abdominal pain   Leukocytosis   Polysubstance abuse (HCC)   Intractable nausea and vomiting   DKA (diabetic ketoacidoses): Type 1 diabetes mellitus Patient presenting to ED with intractable nausea and vomiting.  Found to have elevated blood glucose of 537 with an anion gap of 22 and 80 ketones in his urine.  Patient reports recently [place] from [place] and has been out of his home insulin.  He  reports his home insulin regimen is Levemir 40-45 units subcutaneously daily with a NovoLog insulin sliding scale.  Hemoglobin A1c 13.8, compatible with extremely poorly controlled diabetes with previous hemoglobin A1c 7.4 on 12/05/2007. --AG 22-->17-->9-->13-->8 --Transitioned from insulin gtt to NPH 24u [place] BID on 02/16/2019 --Continue to monitor CBGs qAC/HS --Continue sensitive insulin sliding scale for further coverage --Will continue to monitor her additional insulin requirements and adjust NPH as needed  Elevated blood pressure: Patient with no known history of hypertension.  Blood pressure elevated up to 190/120 this am --increase lisinopril 20 mg p.o. daily  --Labetalol 20 mg IV prn --continue to monitor blood pressure closely, and titrate antihypertensives as necessary  Polysubstance abuse and Tobacco abuse: UDS positive for amphetamines.  Does not appear to be on any prescription medication. --Counseled on need for substance abuse and tobacco cessation --Nicotine patch  Intractable nausea, vomiting and abdominal pain:  Etiology likely secondary to DKA with gastroparesis as above.  Right upper quadrant ultrasound unrevealing.  --PRN Zofran  Coffee ground emesis:  Hemoglobin stable, 16.6. FOBT negative.  Likely from intractable nausea/vomiting from underlying DKA/gastroparesis as above. --Protonix 40 mg PO BID  Abnormal liver function:  Hepatitis C antibody positive: Mild abnormal liver function with AST 42, ALT 50, total bilirubin 3.0, ALP 178.  Abdominal ultrasound is negative.  --acute hepatitis panel with positive hepatitis C antibody  --HIV antibody nonreactive --repeat CMP in am  Leukocytosis:  WBC 28.7 on admission. No fever, no signs of infection.  Possibly due to DKA/reactive leukocytosis. --WBC 28.7-->21.5 --blood culture  x no growth x24 hours --repeat CBC in am  Pseudo-hyponatremia Sodium 132 on admission, with elevated glucose 537 likely  pseudohyponatremia.  Corrected for elevated glucose is 139.  Chronic low back pain --Oxycodone 5 mg PO prn --DC IV morphine    DVT prophylaxis: SCDs Code Status: Full code Family Communication: None Disposition Plan: Transfer to med/surg; continue titration of NPH, anticipate discharge home in 1-2 days, awaiting diabetic educator consult   Consultants:   None  Procedures:   None  Antimicrobials:  None   Subjective: Patient seen and examined bedside, complaining of his chronic back pain.  Tolerating diet without nausea/vomiting.  No other complaints at this time.  Denies headache, no fever/chills/night sweats, no nausea/vomit/diarrhea, no chest pain, no palpitations, no abdominal pain, no weakness, no cough/congestion, no paresthesias.  No acute events overnight per nursing staff.  Objective: Vitals:   02/17/19 0424 02/17/19 0500 02/17/19 0800 02/17/19 0921  BP:  (!) 148/84 (!) 154/114 (!) 147/101  Pulse:  87 81   Resp:  (!) 22 (!) 9   Temp: 98.3 F (36.8 C)  98.3 F (36.8 C)   TempSrc: Oral  Oral   SpO2:  97% 99%   Weight:      Height:        Intake/Output Summary (Last 24 hours) at 02/17/2019 1013 Last data filed at 02/17/2019 0800 Gross per 24 hour  Intake 1948.4 ml  Output 1700 ml  Net 248.4 ml   Filed Weights   02/15/19 2111  Weight: 72.6 kg    Examination:  General exam: Appears calm and comfortable, appears older than stated age HEENT: PERRL, EOMI, NCAT Respiratory system: Clear to auscultation. Respiratory effort normal. Cardiovascular system: S1 & S2 heard, RRR. No JVD, murmurs, rubs, gallops or clicks. No pedal edema. Gastrointestinal system: Abdomen is nondistended, soft and nontender. No organomegaly or masses felt. Normal bowel sounds heard. Central nervous system: Alert and oriented. No focal neurological deficits. Extremities: Symmetric 5 x 5 power. Skin: No rashes, lesions or ulcers Psychiatry: Judgement and insight appear normal. Mood &  affect appropriate.     Data Reviewed: I have personally reviewed following labs and imaging studies  CBC: Recent Labs  Lab 02/15/19 2141 02/17/19 0208  WBC 28.7* 21.5*  NEUTROABS 24.8*  --   HGB 16.7 13.2  HCT 48.0 38.8*  MCV 81.5 84.0  PLT 395 276   Basic Metabolic Panel: Recent Labs  Lab 02/16/19 0722 02/16/19 0808 02/16/19 1215 02/16/19 1547 02/17/19 0208  NA 139 141 138 134* 133*  K 3.8 3.7 3.2* 3.4* 3.4*  CL 98 100 103 98 102  CO2 24 24 26 23 23   GLUCOSE 283* 285* 152* 194* 209*  BUN 27* 27* 24* 20 11  CREATININE 1.01 1.00 0.69 0.88 0.64  CALCIUM 9.5 9.3 8.7* 8.6* 8.4*  MG  --   --   --   --  2.0   GFR: Estimated Creatinine Clearance: 124.8 mL/min (by C-G formula based on SCr of 0.64 mg/dL). Liver Function Tests: Recent Labs  Lab 02/15/19 2141 02/17/19 0208  AST 42* 23  ALT 50* 33  ALKPHOS 178* 111  BILITOT 3.0* 1.3*  PROT 7.6 5.6*  ALBUMIN 3.9 2.8*   Recent Labs  Lab 02/15/19 2141  LIPASE 23   No results for input(s): AMMONIA in the last 168 hours. Coagulation Profile: Recent Labs  Lab 02/15/19 2141  INR 0.9   Cardiac Enzymes: No results for input(s): CKTOTAL, CKMB, CKMBINDEX, TROPONINI in the last 168  hours. BNP (last 3 results) No results for input(s): PROBNP in the last 8760 hours. HbA1C: Recent Labs    02/16/19 0649  HGBA1C 13.8*   CBG: Recent Labs  Lab 02/16/19 1132 02/16/19 1237 02/16/19 1349 02/16/19 1925 02/17/19 0735  GLUCAP 150* 131* 137* 223* 223*   Lipid Profile: No results for input(s): CHOL, HDL, LDLCALC, TRIG, CHOLHDL, LDLDIRECT in the last 72 hours. Thyroid Function Tests: No results for input(s): TSH, T4TOTAL, FREET4, T3FREE, THYROIDAB in the last 72 hours. Anemia Panel: No results for input(s): VITAMINB12, FOLATE, FERRITIN, TIBC, IRON, RETICCTPCT in the last 72 hours. Sepsis Labs: No results for input(s): PROCALCITON, LATICACIDVEN in the last 168 hours.  Recent Results (from the past 240 hour(s))   SARS CORONAVIRUS 2 (TAT 6-24 HRS) Nasopharyngeal Nasopharyngeal Swab     Status: None   Collection Time: 02/15/19  9:48 PM   Specimen: Nasopharyngeal Swab  Result Value Ref Range Status   SARS Coronavirus 2 NEGATIVE NEGATIVE Final    Comment: (NOTE) SARS-CoV-2 target nucleic acids are NOT DETECTED. The SARS-CoV-2 RNA is generally detectable in upper and lower respiratory specimens during the acute phase of infection. Negative results do not preclude SARS-CoV-2 infection, do not rule out co-infections with other pathogens, and should not be used as the sole basis for treatment or other patient management decisions. Negative results must be combined with clinical observations, patient history, and epidemiological information. The expected result is Negative. Fact Sheet for Patients: HairSlick.no Fact Sheet for Healthcare Providers: quierodirigir.com This test is not yet approved or cleared by the [place] FDA and  has been authorized for detection and/or diagnosis of SARS-CoV-2 by FDA under an Emergency Use Authorization (EUA). This EUA will remain  in effect (meaning this test can be used) for the duration of the COVID-19 declaration under Section 56 4(b)(1) of the Act, 21 U.S.C. section 360bbb-3(b)(1), unless the authorization is terminated or revoked sooner. Performed at [facility], [address] [place], [place] [address]   MRSA PCR Screening     Status: None   Collection Time: 02/16/19  6:47 AM   Specimen: Nasopharyngeal Wash  Result Value Ref Range Status   MRSA by PCR NEGATIVE NEGATIVE Final    Comment:        The GeneXpert MRSA Assay (FDA approved for NASAL specimens only), is one component of a comprehensive MRSA colonization surveillance program. It is not intended to diagnose MRSA infection nor to guide or monitor treatment for MRSA infections. Performed at [facility], [address], [place], [place] [address]   Culture, blood (Routine X 2) w Reflex to ID Panel     Status: None (Preliminary result)   Collection Time: 02/16/19  7:23 AM   Specimen: BLOOD  Result Value Ref Range Status   Specimen Description   Final    BLOOD RIGHT ARM Performed at [facility], [address] [place], [place] [address]    Special Requests   Final    BOTTLES DRAWN AEROBIC ONLY Blood Culture results may not be optimal due to an excessive volume of blood received in culture bottles Performed at [facility], [address], [place], [place] [address]    Culture   Final    NO GROWTH < 24 HOURS Performed at [facility], [address] [place], [place] [address]    Report Status PENDING  Incomplete  Culture, blood (Routine X 2) w Reflex to ID Panel  Status: None (Preliminary result)   Collection Time: 02/16/19  7:24 AM   Specimen: BLOOD  Result Value Ref Range Status   Specimen Description   Final    BLOOD RIGHT ARM Performed at [facility] [address], [place], [place] [address]    Special Requests   Final    BOTTLES DRAWN AEROBIC ONLY Blood Culture results may not be optimal due to an inadequate volume of blood received in culture bottles Performed at [facility] [address] [place], [place] [address]    Culture   Final    NO GROWTH < 24 HOURS Performed at [facility] [address], [place], [place] [address]    Report Status PENDING  Incomplete         Radiology Studies: US Abdomen Limited Ruq  Result Date: 02/16/2019 CLINICAL DATA:  Elevated LFTs EXAM: ULTRASOUND ABDOMEN LIMITED RIGHT UPPER QUADRANT COMPARISON:  None. FINDINGS: Gallbladder: No gallstones or wall thickening visualized. No sonographic Murphy sign noted by sonographer. Common bile duct: Diameter: 4 mm Liver: No focal lesion identified. Within normal limits in parenchymal echogenicity. Portal  vein is patent on color Doppler imaging with normal direction of blood flow towards the liver. Other: None. IMPRESSION: Normal liver and gallbladder. Electronically Signed   By: Prudencio Pair M.D.   On: 02/16/2019 01:39        Scheduled Meds: . Chlorhexidine Gluconate Cloth  6 each Topical Daily  . insulin aspart  0-5 Units Subcutaneous QHS  . insulin aspart  0-9 Units Subcutaneous TID WC  . insulin NPH Human  24 Units Subcutaneous BID AC & HS  . lisinopril  20 mg Oral Daily  . nicotine  21 mg Transdermal Daily  . pantoprazole  40 mg Oral Daily   Continuous Infusions:    LOS: 1 day    Time spent: 34 minutes spent on chart review, discussion with nursing staff, consultants, updating family and interview/physical exam; more than 50% of that time was spent in counseling and/or coordination of care.     Angle Dirusso J [place] DO [facility] Pager [phone]  If 7PM-7AM, please contact night-coverage [email or web address] Password [email or web address] 02/17/2019, 10:13 AM

## 2019-02-17 NOTE — Progress Notes (Signed)
Pt. transferred to [place] to room 1516. Report given to recieving nurse. Pt. Stable. All patient belongings from room transferred with patient.

## 2019-02-17 NOTE — Progress Notes (Signed)
Inpatient Diabetes Program Recommendations  AACE/ADA: New Consensus Statement on Inpatient Glycemic Control (2015)  Target Ranges:  Prepandial:   less than 140 mg/dL      Peak postprandial:   less than 180 mg/dL (1-2 hours)      Critically ill patients:  140 - 180 mg/dL   Lab Results  Component Value Date   GLUCAP 223 (H) 02/17/2019   HGBA1C 13.8 (H) 02/16/2019    Review of Glycemic Control  Diabetes history: DM type 1 Outpatient Diabetes medications: Lantus 45 units qpm, Humalog 3-18 units tid Current orders for Inpatient glycemic control: NPH 24 units bid Novolog 0-9 units tid + hs  Inpatient Diabetes Program Recommendations:    Noted NPH increased to 24 units bid. When pt is eating will benefit from meal coverage since he is type 1 and does not make insulin.    Spoke with pt briefly while verifying information. Pt asked for me to speak with Belenda Cruise his roommate as he did not feel well. Patient reports she is the one that helps him anyway.  Belenda Cruise reports pt has had DM since the age of 26. He has extensive knowledge of DM and self care. Pt recently relocated without insurance. Pt ran out of insulin. Pt knew about the [employer] insulin but could not afford it. Pt without a job with no income.  Discussed basic pathophysiology of DM type 1 to katherine. Discussed how DKA develops. Discussed effects on uncontrolled glucose on the body. Discussed development of Gastroparesis. Discussed glucose and A1c goals. Discussed current A1c 13.8%.  Patient will need clinic follow up appointment to be made Monday to be able to get insulin for the first month at the clinic pharmacy and be placed on a pay tier after that.   If discharged over the weekend pt can do [facility] letter from [facility] with a list of pharmacies to go to and will have to call the clinic specified by CM on Monday for appointment.  Pt is familiar with both vial and syringe and insulin pen methods of insulin delivery.  Will  follow.  Thanks,  Tama Headings RN, MSN, BC-ADM Inpatient Diabetes Coordinator Team Pager [phone] (8a-5p)

## 2019-02-17 NOTE — Progress Notes (Signed)
Patient asked for pain medication. As soon as nurse administered the patient medication, patient requesting to leave AMA. MD notified. Patient educated about AMA policy. IVs removed. Patient given the AMA paper to sign to put in chart.

## 2019-02-17 NOTE — Progress Notes (Signed)
On monitor rhythm with elevated ST wave: Patient denies SOB, Chest and pain and pressure: he is completely  Asymptomatic: discussed with Midlevel provider on call : just monitor for now

## 2019-02-18 NOTE — Discharge Summary (Signed)
Physician [facility] Discharge Summary  Ryan Richard:[id number] DOB: Nov 19, 1975 DOA: 02/15/2019  PCP: Patient, No Pcp Per  Admit date: 02/15/2019 Discharge date: 02/18/2019  Admitted From: Home Disposition: Left AGAINST MEDICAL ADVICE   History of present illness:  Ryan Shuler Stiersis a 43 y.o.malewith medical history significant oftype 1 diabetes, chronic back pain, polysubstance abuse, tobacco abuse, who presents with intractable nausea, vomiting, abdominal pain, coffee-ground emesis.  Patient states that he has been having nausea,vomiting abdominal pain for more than 2 days. He has vomited coffee-ground materials more than 12 times today. Abdominal pain is located in right upper quadrant, constant, initially 10 out of 10 severity, currently 5 out of 10 severity, sharp, nonradiating. No diarrhea. No fever or chills. Patient does not have chest pain, shortness of breath, cough. No symptoms of UTI or unilateral weakness.  ED Course: was found to have WBC 28.7, pending COVID-19 test, negative FOBT, blood sugar 537, bicarbonate 25, anion gap 22, abnormal liver function (ALP 178, AST 42, ALT 50, total protein 3.0), alcohol level less than 10, lipase 23, INR 0.9, urinalysis negative for UTI, but positive for ketone 80, electrolytes renal function okay, temperature normal, blood pressure 182/126, 154/92, tachycardia, oxygen saturation 96% on room air. RUQultrasound negative. Patient is admitted to stepdown asinpatient.  Hospital course:  DKA (diabetic ketoacidoses): Type 1 diabetes mellitus Patient presenting to ED with intractable nausea and vomiting.  Found to have elevated blood glucose of 537 with an anion gap of 22 and 80 ketones in his urine.  Patient reports recently moved from [place] and has been out of his home insulin.  He reports his home insulin regimen is Levemir 40-45 units subcutaneously daily with a NovoLog insulin sliding scale.  Hemoglobin A1c 13.8, compatible with  extremely poorly controlled diabetes with previous hemoglobin A1c 7.4 on 12/05/2007.  Patient was started on insulin drip, with resolution of diabetic ketoacidosis with closure of anion gap.  He was transitioned from the insulin drip to NPH given that he has no insurance and for affordability reasons at time of discharge.  During the evening of 02/17/2019, patient decided to leave AGAINST MEDICAL ADVICE.  Reasonable efforts were made to advise the patient of the benefit of staying for evaluation as well as treatment. The patient had the decision-making capacity to refuse treatment at this time.  It was discussed with the patient associated risks of leaving the hospital prior to completion of workup and treatment in the patient voiced understanding. Alternatives were discussed to current care plan, but patient still voiced his decision to refuse treatment. Questions were answered and discussion of need for follow-up was discussed. The AGAINST MEDICAL ADVICE paperwork was signed prior to departure from the hospital.  Anticipate patient will return to urgent care or the emergency department fairly quickly given his inability to afford insulin, even though he likely purchases history to methamphetamines.   Elevated blood pressure: Patient with no known history of hypertension.  Blood pressure elevated up to 190/120.  Patient was started on lisinopril for renal protection.  Left AMA as above.  Polysubstance abuseandTobacco abuse: UDS positive for amphetamines.  Does not appear to be on any prescription medication. Counseled on need for substance abuse and tobacco cessation.  Started on nicotine patch while inpatient.  Patient left right after receiving pain medication AGAINST MEDICAL ADVICE as above.  Intractable nausea, vomiting and abdominalpain:  Etiology likely secondary to DKA with gastroparesis as above.  Right upper quadrant ultrasound unrevealing.  Coffee ground emesis: Hemoglobin  stable,  16.6. FOBT negative.  Likely from intractable nausea/vomiting from underlying DKA/gastroparesis as above.  Was started on Protonix 40 mg p.o. twice daily while inpatient.  Abnormal liver function: Hepatitis C antibody positive: Mild abnormal liver function with AST 42, ALT 50, total bilirubin 3.0, ALP 178. Abdominal ultrasound is negative. Acute hepatitis panel with positive hepatitis C antibody . HIV antibody nonreactive.  Leukocytosis: WBC 28.7 on admission. No fever, no signs of infection. Possibly due to DKA/reactive leukocytosis.  White blood cell count improved to 21.5 prior to leaving AMA.  No signs of infectious etiology appreciated on physical exam or other lab findings.  Pseudo-hyponatremia Sodium 132 on admission, with elevated glucose 537 likely pseudohyponatremia.  Corrected for elevated glucose is 139.   Discharge Diagnoses:  Principal Problem:   DKA (diabetic ketoacidoses) (HCC) Active Problems:   Type 1 diabetes mellitus (HCC)   Tobacco abuse   Coffee ground emesis   Abnormal liver function   Abdominal pain   Leukocytosis   Polysubstance abuse (HCC)   Intractable nausea and vomiting    Discharge Instructions   Allergies as of 02/17/2019      Reactions   Penicillins Hives      Medication List    ASK your doctor about these medications   insulin detemir 100 UNIT/ML injection Commonly known as: LEVEMIR Inject 45 Units into the skin every evening.   insulin glargine 100 UNIT/ML injection Commonly known as: LANTUS Inject 45 Units into the skin every evening.   insulin lispro 100 UNIT/ML injection Commonly known as: HUMALOG Inject 3-18 Units into the skin 3 (three) times daily before meals. Sliding scale   naproxen 500 MG tablet Commonly known as: NAPROSYN Take 1 tablet (500 mg total) by mouth 2 (two) times daily.   ondansetron 4 MG disintegrating tablet Commonly known as: Zofran ODT Take 1 tablet (4 mg total) by mouth every 8 (eight) hours as  needed for nausea.       Allergies  Allergen Reactions  . Penicillins Hives    Consultations:  None   Procedures/Studies: US Abdomen Limited Ruq  Result Date: 02/16/2019 CLINICAL DATA:  Elevated LFTs EXAM: ULTRASOUND ABDOMEN LIMITED RIGHT UPPER QUADRANT COMPARISON:  None. FINDINGS: Gallbladder: No gallstones or wall thickening visualized. No sonographic Murphy sign noted by sonographer. Common bile duct: Diameter: 4 mm Liver: No focal lesion identified. Within normal limits in parenchymal echogenicity. Portal vein is patent on color Doppler imaging with normal direction of blood flow towards the liver. Other: None. IMPRESSION: Normal liver and gallbladder. Electronically Signed   By: Jonna Clark M.D.   On: 02/16/2019 01:39    Discharge Exam: Vitals:   02/17/19 1100 02/17/19 1348  BP: (!) 147/105 (!) 145/98  Pulse: 85 (!) 103  Resp:  20  Temp:  99.4 F (37.4 C)  SpO2: 97% 98%   Vitals:   02/17/19 0921 02/17/19 1000 02/17/19 1100 02/17/19 1348  BP: (!) 147/101 137/89 (!) 147/105 (!) 145/98  Pulse:  85 85 (!) 103  Resp:  17  20  Temp:    99.4 F (37.4 C)  TempSrc:    Oral  SpO2:  99% 97% 98%  Weight:      Height:        Exam not performed as patient left AMA    The results of significant diagnostics from this hospitalization (including imaging, microbiology, ancillary and laboratory) are listed below for reference.     Microbiology: Recent Results (from the past 240 hour(s))  SARS  CORONAVIRUS 2 (TAT 6-24 HRS) Nasopharyngeal Nasopharyngeal Swab     Status: None   Collection Time: 02/15/19  9:48 PM   Specimen: Nasopharyngeal Swab  Result Value Ref Range Status   SARS Coronavirus 2 NEGATIVE NEGATIVE Final    Comment: (NOTE) SARS-CoV-2 target nucleic acids are NOT DETECTED. The SARS-CoV-2 RNA is generally detectable in upper and lower respiratory specimens during the acute phase of infection. Negative results do not preclude SARS-CoV-2 infection, do not rule  out co-infections with other pathogens, and should not be used as the sole basis for treatment or other patient management decisions. Negative results must be combined with clinical observations, patient history, and epidemiological information. The expected result is Negative. Fact Sheet for Patients: HairSlick.nohttps://www.fda.gov/media/138098/download Fact Sheet for Healthcare Providers: quierodirigir.comhttps://www.fda.gov/media/138095/download This test is not yet approved or cleared by the [place] FDA and  has been authorized for detection and/or diagnosis of SARS-CoV-2 by FDA under an Emergency Use Authorization (EUA). This EUA will remain  in effect (meaning this test can be used) for the duration of the COVID-19 declaration under Section 56 4(b)(1) of the Act, 21 U.S.C. section 360bbb-3(b)(1), unless the authorization is terminated or revoked sooner. Performed at [facility], [address] [place], [place] [address]   MRSA PCR Screening     Status: None   Collection Time: 02/16/19  6:47 AM   Specimen: Nasopharyngeal Wash  Result Value Ref Range Status   MRSA by PCR NEGATIVE NEGATIVE Final    Comment:        The GeneXpert MRSA Assay (FDA approved for NASAL specimens only), is one component of a comprehensive MRSA colonization surveillance program. It is not intended to diagnose MRSA infection nor to guide or monitor treatment for MRSA infections. Performed at [facility], [address] [place], [place] [address]   Culture, blood (Routine X 2) w Reflex to ID Panel     Status: None (Preliminary result)   Collection Time: 02/16/19  7:23 AM   Specimen: BLOOD  Result Value Ref Range Status   Specimen Description   Final    BLOOD RIGHT ARM Performed at [facility], [address] [place], [place] [address]    Special Requests   Final    BOTTLES DRAWN AEROBIC ONLY Blood Culture results may not be optimal due to an excessive volume of blood  received in culture bottles Performed at [facility], [address] [place], [place] [address]    Culture   Final    NO GROWTH 2 DAYS Performed at [facility], [address] [place], [place] [address]    Report Status PENDING  Incomplete  Culture, blood (Routine X 2) w Reflex to ID Panel     Status: None (Preliminary result)   Collection Time: 02/16/19  7:24 AM   Specimen: BLOOD  Result Value Ref Range Status   Specimen Description   Final    BLOOD RIGHT ARM Performed at [facility], [address] [place], [place] [address]    Special Requests   Final    BOTTLES DRAWN AEROBIC ONLY Blood Culture results may not be optimal due to an inadequate volume of blood received in culture bottles Performed at [facility], [address] [place], [place] [address]    Culture   Final    NO GROWTH 2 DAYS Performed at [facility], [address] [place], [place] [address]  Report Status PENDING  Incomplete     Labs: BNP (last 3 results) No results for input(s): BNP in the last 8760 hours. Basic Metabolic Panel: Recent Labs  Lab 02/16/19 0722 02/16/19 0808 02/16/19 1215 02/16/19 1547 02/17/19 0208  NA 139 141 138 134* 133*  K 3.8 3.7 3.2* 3.4* 3.4*  CL 98 100 103 98 102  CO2 24 24 26 23 23   GLUCOSE 283* 285* 152* 194* 209*  BUN 27* 27* 24* 20 11  CREATININE 1.01 1.00 0.69 0.88 0.64  CALCIUM 9.5 9.3 8.7* 8.6* 8.4*  MG  --   --   --   --  2.0   Liver Function Tests: Recent Labs  Lab 02/15/19 2141 02/17/19 0208  AST 42* 23  ALT 50* 33  ALKPHOS 178* 111  BILITOT 3.0* 1.3*  PROT 7.6 5.6*  ALBUMIN 3.9 2.8*   Recent Labs  Lab 02/15/19 2141  LIPASE 23   No results for input(s): AMMONIA in the last 168 hours. CBC: Recent Labs  Lab 02/15/19 2141 02/17/19 0208  WBC 28.7* 21.5*  NEUTROABS 24.8*  --   HGB 16.7 13.2  HCT 48.0 38.8*  MCV 81.5 84.0  PLT 395 276   Cardiac Enzymes: No  results for input(s): CKTOTAL, CKMB, CKMBINDEX, TROPONINI in the last 168 hours. BNP: Invalid input(s): POCBNP CBG: Recent Labs  Lab 02/16/19 1349 02/16/19 1925 02/17/19 0735 02/17/19 1142 02/17/19 1614  GLUCAP 137* 223* 223* 415* 260*   D-Dimer No results for input(s): DDIMER in the last 72 hours. Hgb A1c Recent Labs    02/16/19 0649  HGBA1C 13.8*   Lipid Profile No results for input(s): CHOL, HDL, LDLCALC, TRIG, CHOLHDL, LDLDIRECT in the last 72 hours. Thyroid function studies No results for input(s): TSH, T4TOTAL, T3FREE, THYROIDAB in the last 72 hours.  Invalid input(s): FREET3 Anemia work up No results for input(s): VITAMINB12, FOLATE, FERRITIN, TIBC, IRON, RETICCTPCT in the last 72 hours. Urinalysis    Component Value Date/Time   COLORURINE STRAW (A) 02/16/2019 0053   APPEARANCEUR CLEAR 02/16/2019 0053   LABSPEC 1.032 (H) 02/16/2019 0053   PHURINE 6.0 02/16/2019 0053   GLUCOSEU >=500 (A) 02/16/2019 0053   HGBUR NEGATIVE 02/16/2019 0053   HGBUR negative 12/25/2007 1153   BILIRUBINUR NEGATIVE 02/16/2019 0053   KETONESUR 80 (A) 02/16/2019 0053   PROTEINUR NEGATIVE 02/16/2019 0053   UROBILINOGEN 0.2 05/25/2014 1026   NITRITE NEGATIVE 02/16/2019 0053   LEUKOCYTESUR NEGATIVE 02/16/2019 0053   Sepsis Labs Invalid input(s): PROCALCITONIN,  WBC,  LACTICIDVEN Microbiology Recent Results (from the past 240 hour(s))  SARS CORONAVIRUS 2 (TAT 6-24 HRS) Nasopharyngeal Nasopharyngeal Swab     Status: None   Collection Time: 02/15/19  9:48 PM   Specimen: Nasopharyngeal Swab  Result Value Ref Range Status   SARS Coronavirus 2 NEGATIVE NEGATIVE Final    Comment: (NOTE) SARS-CoV-2 target nucleic acids are NOT DETECTED. The SARS-CoV-2 RNA is generally detectable in upper and lower respiratory specimens during the acute phase of infection. Negative results do not preclude SARS-CoV-2 infection, do not rule out co-infections with other pathogens, and should not be used as  the sole basis for treatment or other patient management decisions. Negative results must be combined with clinical observations, patient history, and epidemiological information. The expected result is Negative. Fact Sheet for Patients: SugarRoll.be Fact Sheet for Healthcare Providers: https://www.woods-mathews.com/ This test is not yet approved or cleared by the [place] FDA and  has been authorized for detection and/or diagnosis of SARS-CoV-2 by  FDA under an Emergency Use Authorization (EUA). This EUA will remain  in effect (meaning this test can be used) for the duration of the COVID-19 declaration under Section 56 4(b)(1) of the Act, 21 U.S.C. section 360bbb-3(b)(1), unless the authorization is terminated or revoked sooner. Performed at [facility], [address], [place], [place] [address]   MRSA PCR Screening     Status: None   Collection Time: 02/16/19  6:47 AM   Specimen: Nasopharyngeal Wash  Result Value Ref Range Status   MRSA by PCR NEGATIVE NEGATIVE Final    Comment:        The GeneXpert MRSA Assay (FDA approved for NASAL specimens only), is one component of a comprehensive MRSA colonization surveillance program. It is not intended to diagnose MRSA infection nor to guide or monitor treatment for MRSA infections. Performed at [facility], [address], [place], [place] [address]   Culture, blood (Routine X 2) w Reflex to ID Panel     Status: None (Preliminary result)   Collection Time: 02/16/19  7:23 AM   Specimen: BLOOD  Result Value Ref Range Status   Specimen Description   Final    BLOOD RIGHT ARM Performed at [facility], [address], [place], [place] [address]    Special Requests   Final    BOTTLES DRAWN AEROBIC ONLY Blood Culture results may not be optimal due to an excessive volume of blood received in culture bottles Performed at [facility], [address], [place], [place] [address]    Culture   Final    NO GROWTH 2 DAYS Performed at [facility], [address] [place], [place] [address]    Report Status PENDING  Incomplete  Culture, blood (Routine X 2) w Reflex to ID Panel     Status: None (Preliminary result)   Collection Time: 02/16/19  7:24 AM   Specimen: BLOOD  Result Value Ref Range Status   Specimen Description   Final    BLOOD RIGHT ARM Performed at [facility], [address] [place], [place] [address]    Special Requests   Final    BOTTLES DRAWN AEROBIC ONLY Blood Culture results may not be optimal due to an inadequate volume of blood received in culture bottles Performed at [facility], [address] [place], [place] [address]    Culture   Final    NO GROWTH 2 DAYS Performed at [facility], [address], [place], [place] [address]    Report Status PENDING  Incomplete     Time coordinating discharge: Over 30 minutes  SIGNED:   Alvira Philips [place], DO  [facility] 02/18/2019, 7:45 AM

## 2019-02-19 LAB — GLUCOSE, CAPILLARY
Glucose-Capillary: 175 mg/dL — ABNORMAL HIGH (ref 70–99)
Glucose-Capillary: 179 mg/dL — ABNORMAL HIGH (ref 70–99)
Glucose-Capillary: 177 mg/dL — ABNORMAL HIGH (ref 70–99)
Glucose-Capillary: 192 mg/dL — ABNORMAL HIGH (ref 70–99)
Glucose-Capillary: 204 mg/dL — ABNORMAL HIGH (ref 70–99)
Glucose-Capillary: 346 mg/dL — ABNORMAL HIGH (ref 70–99)

## 2019-02-21 LAB — CULTURE, BLOOD (ROUTINE X 2)
Culture: NO GROWTH
Culture: NO GROWTH

## 2020-11-13 ENCOUNTER — Ambulatory Visit (HOSPITAL_COMMUNITY): Admission: EM | Admit: 2020-11-13 | Discharge: 2020-11-13 | Discharge status: Against Medical Advice | Payer: Self-pay

## 2020-11-13 ENCOUNTER — Other Ambulatory Visit: Payer: Self-pay

## 2021-03-14 IMAGING — US US ABDOMEN LIMITED
1 series · 14 of 25 positions shown · non-contrast
Comparison: None.

CLINICAL DATA: Elevated LFTs

EXAM:
ULTRASOUND ABDOMEN LIMITED RIGHT UPPER QUADRANT

[Series 1: us abdomen limited · 60 acquisitions, 14 frames shown]
[im 1/60]
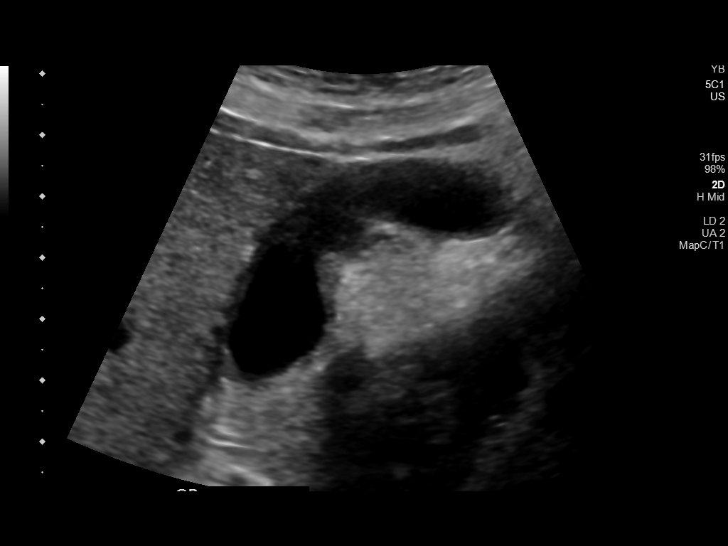
[im 5/60]
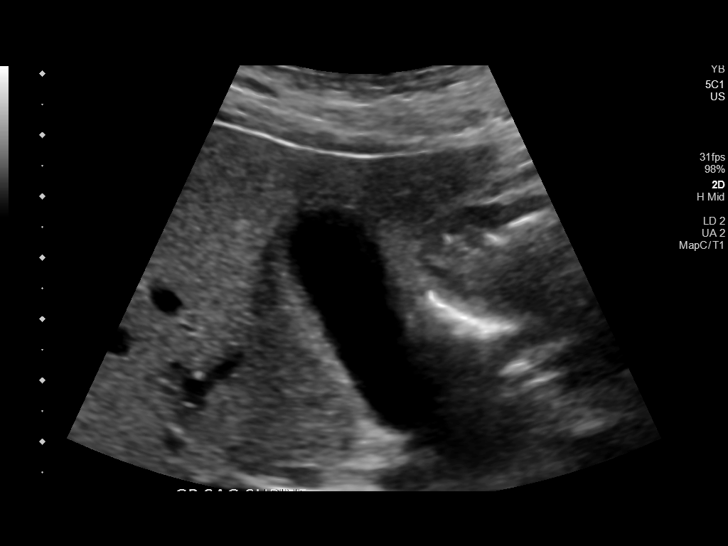
[im 10/60]
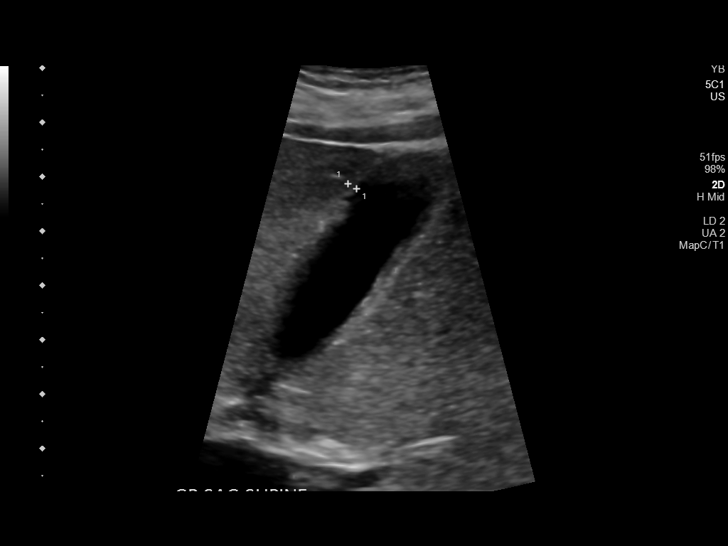
[im 15/60]
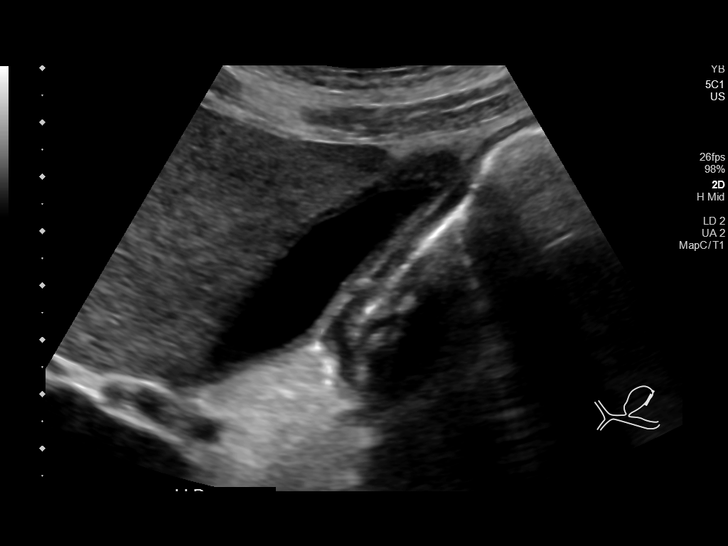
[im 20/60]
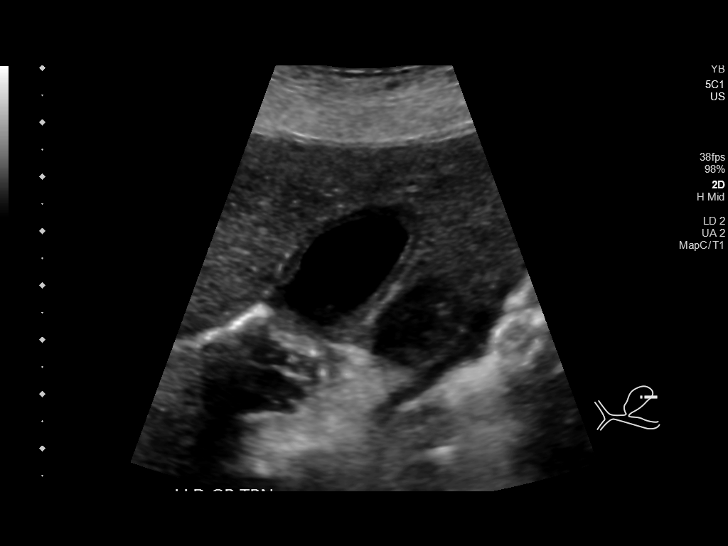
[im 23/60]
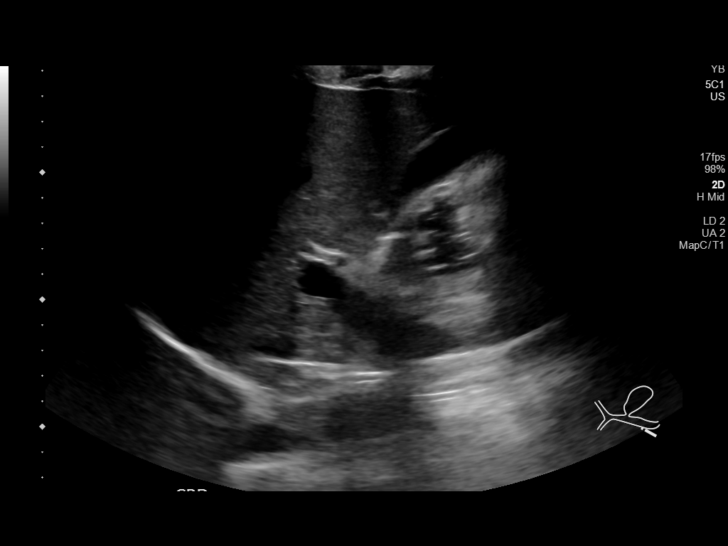
[im 28/60]
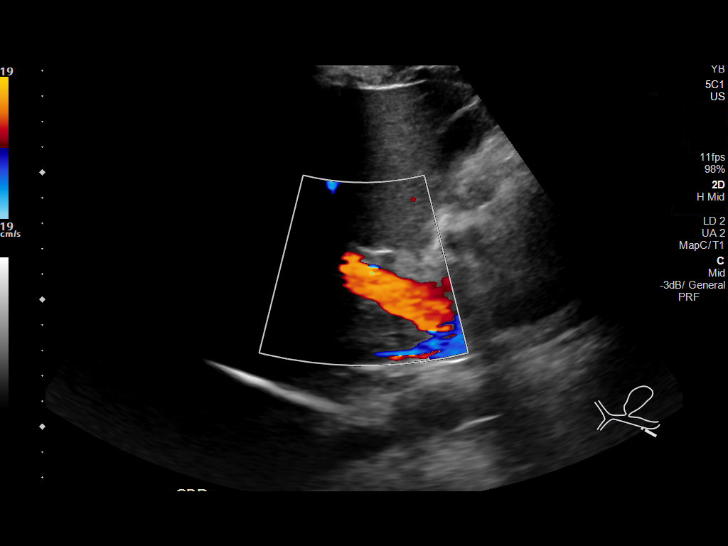
[im 32/60]
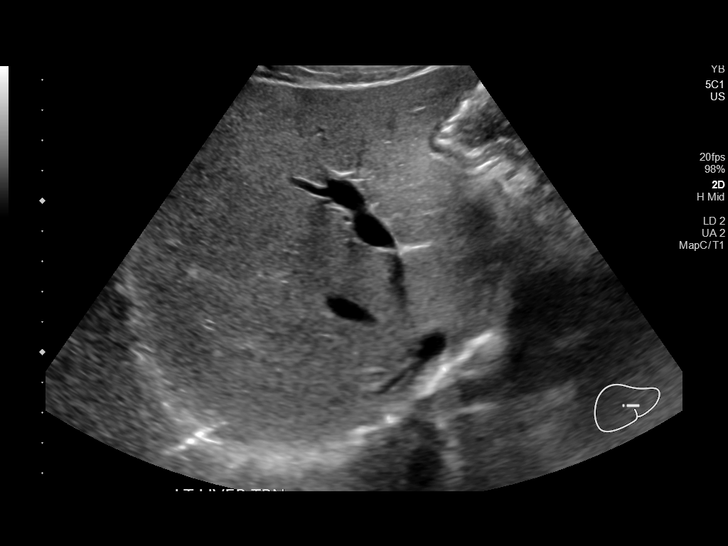
[im 37/60]
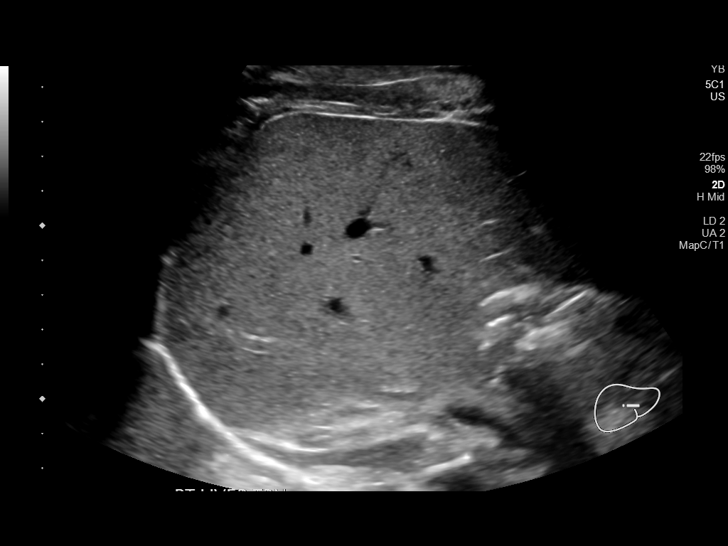
[im 40/60]
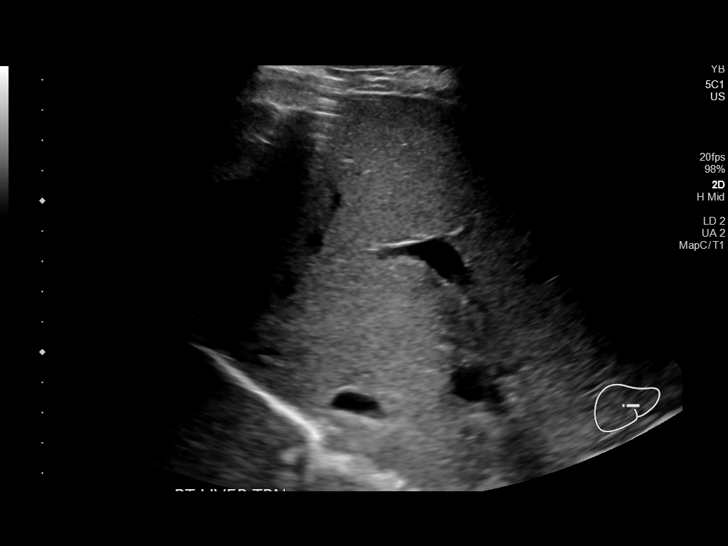
[im 45/60]
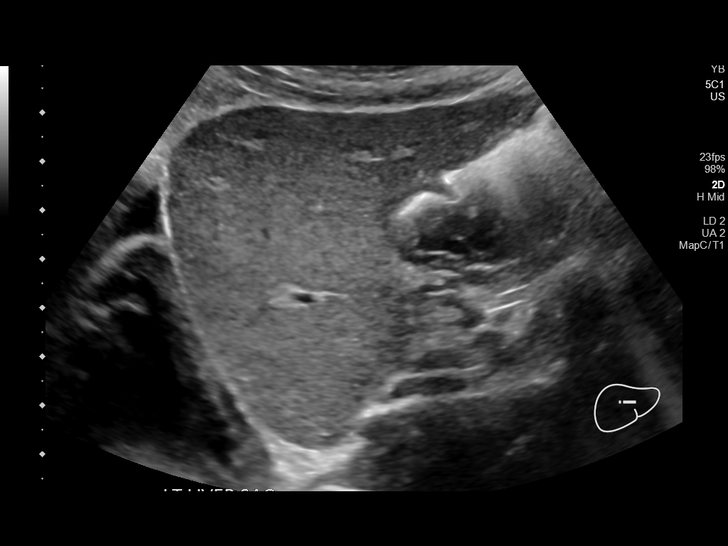
[im 50/60]
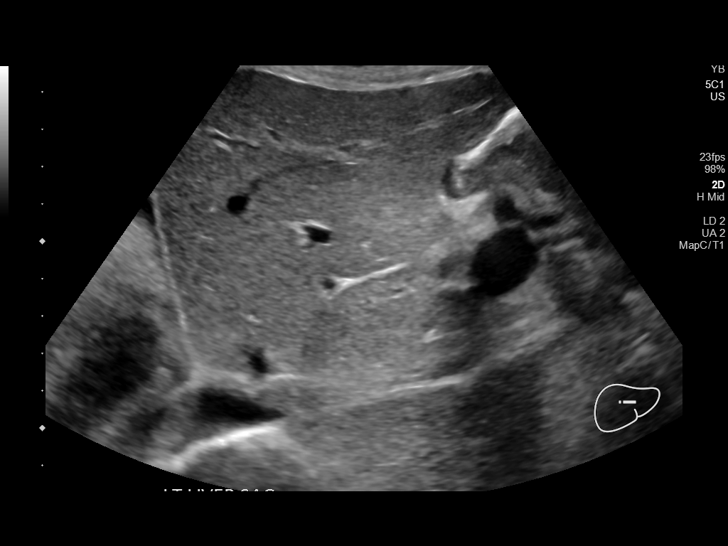
[im 55/60]
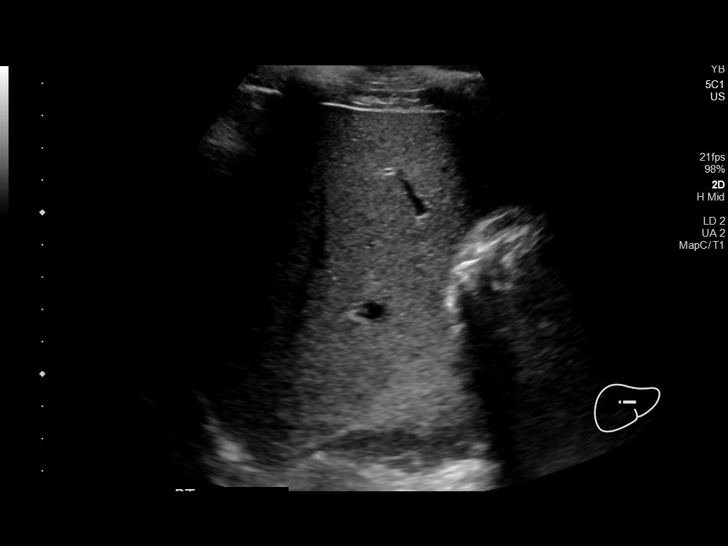
[im 60/60]
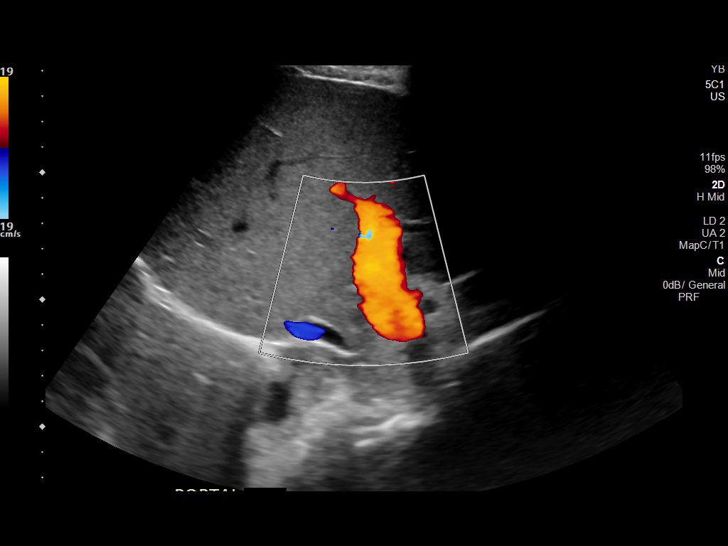

[14 of 25 positions shown; findings below may reference images not displayed]

FINDINGS: Gallbladder:

No gallstones or wall thickening visualized. No sonographic Murphy
sign noted by sonographer.

Common bile duct:

Diameter: 4 mm

Liver:

No focal lesion identified. Within normal limits in parenchymal
echogenicity. Portal vein is patent on color Doppler imaging with
normal direction of blood flow towards the liver.

Other: None.
IMPRESSION: Normal liver and gallbladder.

## 2021-06-26 ENCOUNTER — Ambulatory Visit (HOSPITAL_COMMUNITY)
Admission: EM | Admit: 2021-06-26 | Discharge: 2021-06-26 | Disposition: A | Discharge status: Home / Self Care | Payer: 59 | Attending: Emergency Medicine | Admitting: Emergency Medicine

## 2021-06-26 ENCOUNTER — Emergency Department (HOSPITAL_COMMUNITY): Payer: 59 | Admitting: Certified Registered"

## 2021-06-26 ENCOUNTER — Emergency Department (HOSPITAL_COMMUNITY): Payer: 59

## 2021-06-26 ENCOUNTER — Emergency Department (HOSPITAL_BASED_OUTPATIENT_CLINIC_OR_DEPARTMENT_OTHER): Payer: 59 | Admitting: Certified Registered"

## 2021-06-26 ENCOUNTER — Encounter (HOSPITAL_COMMUNITY)
Admission: EM | Disposition: A | Discharge status: Home / Self Care | Payer: Self-pay | Source: Home / Self Care | Attending: Emergency Medicine

## 2021-06-26 ENCOUNTER — Encounter (HOSPITAL_COMMUNITY): Payer: Self-pay

## 2021-06-26 DIAGNOSIS — Z20822 Contact with and (suspected) exposure to covid-19: Secondary | ICD-10-CM | POA: Diagnosis not present

## 2021-06-26 DIAGNOSIS — S62323B Displaced fracture of shaft of third metacarpal bone, left hand, initial encounter for open fracture: Secondary | ICD-10-CM

## 2021-06-26 DIAGNOSIS — S62305A Unspecified fracture of fourth metacarpal bone, left hand, initial encounter for closed fracture: Secondary | ICD-10-CM | POA: Insufficient documentation

## 2021-06-26 DIAGNOSIS — E109 Type 1 diabetes mellitus without complications: Secondary | ICD-10-CM

## 2021-06-26 DIAGNOSIS — S62303A Unspecified fracture of third metacarpal bone, left hand, initial encounter for closed fracture: Secondary | ICD-10-CM | POA: Insufficient documentation

## 2021-06-26 DIAGNOSIS — Z87891 Personal history of nicotine dependence: Secondary | ICD-10-CM | POA: Insufficient documentation

## 2021-06-26 DIAGNOSIS — S62325B Displaced fracture of shaft of fourth metacarpal bone, left hand, initial encounter for open fracture: Secondary | ICD-10-CM | POA: Diagnosis present

## 2021-06-26 DIAGNOSIS — W408XXA Explosion of other specified explosive materials, initial encounter: Secondary | ICD-10-CM | POA: Insufficient documentation

## 2021-06-26 DIAGNOSIS — S61239A Puncture wound without foreign body of unspecified finger without damage to nail, initial encounter: Secondary | ICD-10-CM | POA: Diagnosis present

## 2021-06-26 DIAGNOSIS — Z23 Encounter for immunization: Secondary | ICD-10-CM | POA: Diagnosis not present

## 2021-06-26 DIAGNOSIS — F319 Bipolar disorder, unspecified: Secondary | ICD-10-CM

## 2021-06-26 DIAGNOSIS — X58XXXA Exposure to other specified factors, initial encounter: Secondary | ICD-10-CM | POA: Diagnosis not present

## 2021-06-26 DIAGNOSIS — S61432A Puncture wound without foreign body of left hand, initial encounter: Secondary | ICD-10-CM

## 2021-06-26 DIAGNOSIS — Z794 Long term (current) use of insulin: Secondary | ICD-10-CM | POA: Diagnosis not present

## 2021-06-26 HISTORY — PX: OPEN REDUCTION INTERNAL FIXATION (ORIF) METACARPAL: SHX6234

## 2021-06-26 LAB — CBC WITH DIFFERENTIAL/PLATELET
Abs Immature Granulocytes: 0.03 10*3/uL (ref 0.00–0.07)
Basophils Absolute: 0.1 10*3/uL (ref 0.0–0.1)
Basophils Relative: 1 %
Eosinophils Absolute: 0.3 10*3/uL (ref 0.0–0.5)
Eosinophils Relative: 4 %
HCT: 39.4 % (ref 39.0–52.0)
Hemoglobin: 13 g/dL (ref 13.0–17.0)
Immature Granulocytes: 0 %
Lymphocytes Relative: 37 %
Lymphs Abs: 2.8 10*3/uL (ref 0.7–4.0)
MCH: 28.6 pg (ref 26.0–34.0)
MCHC: 33 g/dL (ref 30.0–36.0)
MCV: 86.8 fL (ref 80.0–100.0)
Monocytes Absolute: 0.8 10*3/uL (ref 0.1–1.0)
Monocytes Relative: 10 %
Neutro Abs: 3.7 10*3/uL (ref 1.7–7.7)
Neutrophils Relative %: 48 %
Platelets: 275 10*3/uL (ref 150–400)
RBC: 4.54 MIL/uL (ref 4.22–5.81)
RDW: 13.1 % (ref 11.5–15.5)
WBC: 7.6 10*3/uL (ref 4.0–10.5)
nRBC: 0 % (ref 0.0–0.2)

## 2021-06-26 LAB — BASIC METABOLIC PANEL
Anion gap: 5 (ref 5–15)
BUN: 19 mg/dL (ref 6–20)
CO2: 29 mmol/L (ref 22–32)
Calcium: 9.2 mg/dL (ref 8.9–10.3)
Chloride: 103 mmol/L (ref 98–111)
Creatinine, Ser: 1.04 mg/dL (ref 0.61–1.24)
GFR, Estimated: >60 mL/min (ref >60)
Glucose, Bld: 166 mg/dL — ABNORMAL HIGH (ref 70–99)
Potassium: 3.4 mmol/L — ABNORMAL LOW (ref 3.5–5.1)
Sodium: 137 mmol/L (ref 135–145)

## 2021-06-26 LAB — GLUCOSE, CAPILLARY
Glucose-Capillary: 146 mg/dL — ABNORMAL HIGH (ref 70–99)
Glucose-Capillary: 239 mg/dL — ABNORMAL HIGH (ref 70–99)
Glucose-Capillary: 274 mg/dL — ABNORMAL HIGH (ref 70–99)

## 2021-06-26 LAB — RESP PANEL BY RT-PCR (FLU A&B, COVID) ARPGX2
Influenza A by PCR: NEGATIVE
Influenza B by PCR: NEGATIVE
SARS Coronavirus 2 by RT PCR: NEGATIVE

## 2021-06-26 SURGERY — OPEN REDUCTION INTERNAL FIXATION (ORIF) METACARPAL
Anesthesia: Monitor Anesthesia Care | Site: Finger | Laterality: Left

## 2021-06-26 MED ORDER — ONDANSETRON HCL 4 MG/2ML IJ SOLN
4.0000 mg | Freq: Once | INTRAMUSCULAR | Status: AC
  Administered 2021-06-26: 4 mg via INTRAVENOUS
  Filled 2021-06-26: qty 2

## 2021-06-26 MED ORDER — FENTANYL CITRATE (PF) 100 MCG/2ML IJ SOLN
INTRAMUSCULAR | Status: DC | PRN
Start: 2021-06-26 — End: 2021-06-26
  Administered 2021-06-26: 100 ug via INTRAVENOUS

## 2021-06-26 MED ORDER — SODIUM CHLORIDE 0.9 % IR SOLN
Status: DC | PRN
  Administered 2021-06-26: 6000 mL

## 2021-06-26 MED ORDER — MORPHINE SULFATE (PF) 4 MG/ML IV SOLN
4.0000 mg | Freq: Once | INTRAVENOUS | Status: AC
  Administered 2021-06-26: 4 mg via INTRAVENOUS
  Filled 2021-06-26: qty 1

## 2021-06-26 MED ORDER — PROPOFOL 10 MG/ML IV BOLUS
INTRAVENOUS | Status: DC | PRN
  Administered 2021-06-26: 20 mg via INTRAVENOUS

## 2021-06-26 MED ORDER — PROPOFOL 10 MG/ML IV BOLUS
INTRAVENOUS | Status: AC
  Filled 2021-06-26: qty 20

## 2021-06-26 MED ORDER — BUPIVACAINE HCL 0.25 % IJ SOLN
INTRAMUSCULAR | Status: AC
  Filled 2021-06-26: qty 1

## 2021-06-26 MED ORDER — FENTANYL CITRATE (PF) 250 MCG/5ML IJ SOLN
INTRAMUSCULAR | Status: AC
  Filled 2021-06-26: qty 5

## 2021-06-26 MED ORDER — PROMETHAZINE HCL 25 MG/ML IJ SOLN: 6.2500 mg | INTRAMUSCULAR | Status: DC | PRN

## 2021-06-26 MED ORDER — BUPIVACAINE LIPOSOME 1.3 % IJ SUSP
INTRAMUSCULAR | Status: DC | PRN
  Administered 2021-06-26: 10 mL via PERINEURAL

## 2021-06-26 MED ORDER — FENTANYL CITRATE PF 50 MCG/ML IJ SOSY
100.0000 ug | PREFILLED_SYRINGE | Freq: Once | INTRAMUSCULAR | Status: DC
  Filled 2021-06-26: qty 2

## 2021-06-26 MED ORDER — BACITRACIN ZINC 500 UNIT/GM EX OINT
TOPICAL_OINTMENT | CUTANEOUS | Status: AC
  Filled 2021-06-26: qty 28.35

## 2021-06-26 MED ORDER — MIDAZOLAM HCL 2 MG/2ML IJ SOLN
INTRAMUSCULAR | Status: AC
  Filled 2021-06-26: qty 2

## 2021-06-26 MED ORDER — PROPOFOL 500 MG/50ML IV EMUL
INTRAVENOUS | Status: DC | PRN
  Administered 2021-06-26: 100 ug/kg/min via INTRAVENOUS

## 2021-06-26 MED ORDER — LIDOCAINE HCL (CARDIAC) PF 100 MG/5ML IV SOSY
PREFILLED_SYRINGE | INTRAVENOUS | Status: DC | PRN
  Administered 2021-06-26: 20 mg via INTRAVENOUS

## 2021-06-26 MED ORDER — CEFAZOLIN SODIUM-DEXTROSE 2-4 GM/100ML-% IV SOLN
2.0000 g | INTRAVENOUS | Status: AC
  Administered 2021-06-26: 2 g via INTRAVENOUS

## 2021-06-26 MED ORDER — ONDANSETRON HCL 4 MG/2ML IJ SOLN
INTRAMUSCULAR | Status: DC | PRN
  Administered 2021-06-26: 4 mg via INTRAVENOUS

## 2021-06-26 MED ORDER — OXYCODONE HCL 5 MG/5ML PO SOLN: 5.0000 mg | Freq: Once | ORAL | Status: DC | PRN

## 2021-06-26 MED ORDER — 0.9 % SODIUM CHLORIDE (POUR BTL) OPTIME
TOPICAL | Status: DC | PRN
  Administered 2021-06-26: 2000 mL

## 2021-06-26 MED ORDER — ACETAMINOPHEN 500 MG PO TABS: 1000.0000 mg | ORAL_TABLET | Freq: Once | ORAL | Status: DC

## 2021-06-26 MED ORDER — INSULIN ASPART 100 UNIT/ML IJ SOLN
INTRAMUSCULAR | Status: AC
  Filled 2021-06-26: qty 1

## 2021-06-26 MED ORDER — OXYCODONE HCL 5 MG PO TABS
5.0000 mg | ORAL_TABLET | ORAL | 0 refills | Status: DC | PRN
Start: 2021-06-26 — End: 2021-07-03

## 2021-06-26 MED ORDER — LACTATED RINGERS IV SOLN
INTRAVENOUS | Status: DC | PRN
Start: 2021-06-26 — End: 2021-06-26

## 2021-06-26 MED ORDER — INSULIN ASPART 100 UNIT/ML IJ SOLN
4.0000 [IU] | Freq: Once | INTRAMUSCULAR | Status: AC
  Administered 2021-06-26: 4 [IU] via SUBCUTANEOUS

## 2021-06-26 MED ORDER — CEFAZOLIN SODIUM-DEXTROSE 2-4 GM/100ML-% IV SOLN
INTRAVENOUS | Status: AC
  Filled 2021-06-26: qty 100

## 2021-06-26 MED ORDER — BUPIVACAINE-EPINEPHRINE (PF) 0.5% -1:200000 IJ SOLN
INTRAMUSCULAR | Status: DC | PRN
  Administered 2021-06-26: 15 mL via PERINEURAL

## 2021-06-26 MED ORDER — FENTANYL CITRATE (PF) 100 MCG/2ML IJ SOLN
INTRAMUSCULAR | Status: AC
  Filled 2021-06-26: qty 2

## 2021-06-26 MED ORDER — ONDANSETRON HCL 4 MG/2ML IJ SOLN
INTRAMUSCULAR | Status: AC
  Filled 2021-06-26: qty 2

## 2021-06-26 MED ORDER — SODIUM CHLORIDE 0.9 % IV BOLUS
1000.0000 mL | Freq: Once | INTRAVENOUS | Status: AC
  Administered 2021-06-26: 1000 mL via INTRAVENOUS

## 2021-06-26 MED ORDER — MIDAZOLAM HCL 2 MG/2ML IJ SOLN
INTRAMUSCULAR | Status: DC | PRN
  Administered 2021-06-26: 2 mg via INTRAVENOUS

## 2021-06-26 MED ORDER — TETANUS-DIPHTH-ACELL PERTUSSIS 5-2.5-18.5 LF-MCG/0.5 IM SUSY
0.5000 mL | PREFILLED_SYRINGE | Freq: Once | INTRAMUSCULAR | Status: AC
  Administered 2021-06-26: 0.5 mL via INTRAMUSCULAR
  Filled 2021-06-26: qty 0.5

## 2021-06-26 MED ORDER — CLINDAMYCIN PHOSPHATE 900 MG/50ML IV SOLN
900.0000 mg | INTRAVENOUS | Status: AC
  Administered 2021-06-26: 900 mg via INTRAVENOUS
  Filled 2021-06-26: qty 50

## 2021-06-26 MED ORDER — OXYCODONE HCL 5 MG PO TABS: 5.0000 mg | ORAL_TABLET | Freq: Once | ORAL | Status: DC | PRN

## 2021-06-26 MED ORDER — FENTANYL CITRATE PF 50 MCG/ML IJ SOSY: 25.0000 ug | PREFILLED_SYRINGE | INTRAMUSCULAR | Status: DC | PRN

## 2021-06-26 SURGICAL SUPPLY — 71 items
BAG COUNTER SPONGE SURGICOUNT (BAG) ×2 IMPLANT
BLADE SURG 15 STRL LF DISP TIS (BLADE) ×2 IMPLANT
BLADE SURG 15 STRL SS (BLADE) ×2
BNDG CONFORM 2 STRL LF (GAUZE/BANDAGES/DRESSINGS) IMPLANT
BNDG ELASTIC 2X5.8 VLCR STR LF (GAUZE/BANDAGES/DRESSINGS) ×2 IMPLANT
BNDG ELASTIC 3X5.8 VLCR STR LF (GAUZE/BANDAGES/DRESSINGS) ×2 IMPLANT
BNDG ELASTIC 4X5.8 VLCR STR LF (GAUZE/BANDAGES/DRESSINGS) ×2 IMPLANT
BNDG ESMARK 4X9 LF (GAUZE/BANDAGES/DRESSINGS) ×2 IMPLANT
BNDG GAUZE ELAST 4 BULKY (GAUZE/BANDAGES/DRESSINGS) ×6 IMPLANT
BNDG PLASTER X FAST 3X3 WHT LF (CAST SUPPLIES) ×2 IMPLANT
CHLORAPREP W/TINT 26 (MISCELLANEOUS) ×2 IMPLANT
CORD BIPOLAR FORCEPS 12FT (ELECTRODE) ×2 IMPLANT
COVER BACK TABLE 60X90IN (DRAPES) ×2 IMPLANT
COVER MAYO STAND STRL (DRAPES) ×2 IMPLANT
COVER SURGICAL LIGHT HANDLE (MISCELLANEOUS) ×2 IMPLANT
CUFF TOURN SGL QUICK 18X4 (TOURNIQUET CUFF) ×2 IMPLANT
CUFF TOURN SGL QUICK 24 (TOURNIQUET CUFF)
CUFF TRNQT CYL 24X4X16.5-23 (TOURNIQUET CUFF) IMPLANT
DRAIN PENROSE 0.25X18 (DRAIN) ×2 IMPLANT
DRAPE EXTREMITY T 121X128X90 (DISPOSABLE) ×2 IMPLANT
DRAPE OEC MINIVIEW 54X84 (DRAPES) ×1 IMPLANT
DRAPE SHEET LG 3/4 BI-LAMINATE (DRAPES) ×2 IMPLANT
DRAPE SURG 17X23 STRL (DRAPES) ×2 IMPLANT
DRSG ADAPTIC 3X8 NADH LF (GAUZE/BANDAGES/DRESSINGS) ×2 IMPLANT
DRSG KUZMA FLUFF (GAUZE/BANDAGES/DRESSINGS) ×2 IMPLANT
DRSG PAD ABDOMINAL 8X10 ST (GAUZE/BANDAGES/DRESSINGS) ×2 IMPLANT
GAUZE SPONGE 4X4 12PLY STRL (GAUZE/BANDAGES/DRESSINGS) ×3 IMPLANT
GAUZE XEROFORM 1X8 LF (GAUZE/BANDAGES/DRESSINGS) ×2 IMPLANT
GAUZE XEROFORM 5X9 LF (GAUZE/BANDAGES/DRESSINGS) ×1 IMPLANT
GLOVE SURG ENC TEXT LTX SZ7 (GLOVE) ×2 IMPLANT
GLOVE SURG ENC TEXT LTX SZ7.5 (GLOVE) ×1 IMPLANT
GLOVE SURG UNDER POLY LF SZ7 (GLOVE) ×2 IMPLANT
GOWN STRL REUS W/ TWL LRG LVL3 (GOWN DISPOSABLE) ×2 IMPLANT
GOWN STRL REUS W/ TWL XL LVL3 (GOWN DISPOSABLE) ×2 IMPLANT
GOWN STRL REUS W/TWL LRG LVL3 (GOWN DISPOSABLE) ×2
GOWN STRL REUS W/TWL XL LVL3 (GOWN DISPOSABLE) ×1
K-WIRE 1.6MM (0.062") X 102MM (4") (Wire) ×3 IMPLANT
K-WIRE SURGICAL 1.6X102 (WIRE) ×4 IMPLANT
KIT BASIN OR (CUSTOM PROCEDURE TRAY) ×2 IMPLANT
KIT TURNOVER KIT A (KITS) ×2 IMPLANT
LOOP VESSEL MAXI BLUE (MISCELLANEOUS) ×2 IMPLANT
MANIFOLD NEPTUNE II (INSTRUMENTS) ×2 IMPLANT
NDL HYPO 25X1 1.5 SAFETY (NEEDLE) ×1 IMPLANT
NDL KEITH (NEEDLE) IMPLANT
NEEDLE HYPO 25X1 1.5 SAFETY (NEEDLE) ×2 IMPLANT
NEEDLE KEITH (NEEDLE) IMPLANT
PACK ORTHO EXTREMITY (CUSTOM PROCEDURE TRAY) ×2 IMPLANT
PAD ARMBOARD 7.5X6 YLW CONV (MISCELLANEOUS) ×2 IMPLANT
PAD CAST 3X4 CTTN HI CHSV (CAST SUPPLIES) IMPLANT
PAD CAST 4YDX4 CTTN HI CHSV (CAST SUPPLIES) ×2 IMPLANT
PADDING CAST ABS 3INX4YD NS (CAST SUPPLIES) ×1
PADDING CAST ABS COTTON 3X4 (CAST SUPPLIES) ×1 IMPLANT
PADDING CAST COTTON 3X4 STRL (CAST SUPPLIES) ×2
PADDING CAST COTTON 4X4 STRL (CAST SUPPLIES) ×1
PIN CAPS ORTHO GREEN .062 (PIN) ×1 IMPLANT
SET IRRIG Y TYPE TUR BLADDER L (SET/KITS/TRAYS/PACK) ×2 IMPLANT
SOL PREP POV-IOD 4OZ 10% (MISCELLANEOUS) ×4 IMPLANT
SOL SCRUB PVP POV-IOD 4OZ 7.5% (MISCELLANEOUS) ×2
SOLUTION SCRB POV-IOD 4OZ 7.5% (MISCELLANEOUS) IMPLANT
SPIKE FLUID TRANSFER (MISCELLANEOUS) ×2 IMPLANT
SPLINT FIBERGLASS 4X30 (CAST SUPPLIES) ×1 IMPLANT
SPONGE T-LAP 4X18 ~~LOC~~+RFID (SPONGE) ×3 IMPLANT
SUT ETHILON 4 0 PS 2 18 (SUTURE) ×9 IMPLANT
SUT FIBERWIRE 3-0 18 TAPR NDL (SUTURE) ×2
SUTURE FIBERWR 3-0 18 TAPR NDL (SUTURE) ×1 IMPLANT
SYR BULB EAR ULCER 3OZ GRN STR (SYRINGE) ×2 IMPLANT
SYR CONTROL 10ML LL (SYRINGE) IMPLANT
TOWEL OR NON WOVEN STRL DISP B (DISPOSABLE) ×4 IMPLANT
TUBING CONNECTING 10 (TUBING) ×2 IMPLANT
UNDERPAD 30X36 HEAVY ABSORB (UNDERPADS AND DIAPERS) ×2 IMPLANT
YANKAUER SUCT BULB TIP NO VENT (SUCTIONS) ×2 IMPLANT

## 2021-06-26 NOTE — ED Notes (Addendum)
Patient provided with clean pad to wrap around dressed wound. Changed top sheet and blankets. Given warm blanket.

## 2021-06-26 NOTE — ED Notes (Signed)
All of patient's belongings (jewelry, shoes, earbuds, and clothing) sent with patient's significant other, which patient and significant other were both okay with.
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
## 2021-06-26 NOTE — Transfer of Care (Signed)
Immediate Anesthesia Transfer of Care Note  Patient: MOXON MESSLER  Procedure(s) Performed: OPEN REDUCTION INTERNAL FIXATION OF THIRD AND FOURTH METACARPAL (Left: Finger)  Patient Location: PACU  Anesthesia Type:MAC combined with regional for post-op pain  Level of Consciousness: awake, alert , oriented and patient cooperative  Airway & Oxygen Therapy: Patient Spontanous Breathing  Post-op Assessment: Report given to RN and Post -op Vital signs reviewed and stable  Post vital signs: Reviewed and stable  Last Vitals:  Vitals Value Taken Time  BP 142/91 06/26/21 2138  Temp    Pulse 72 06/26/21 2139  Resp 7 06/26/21 2139  SpO2 97 % 06/26/21 2139  Vitals shown include unvalidated device data.  Last Pain:  Vitals:   06/26/21 1740  PainSc: Asleep         Complications: No notable events documented.
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
## 2021-06-26 NOTE — Discharge Instructions (Signed)
    Eufelia Veno, M.D. Hand Surgery  POST-OPERATIVE DISCHARGE INSTRUCTIONS   PRESCRIPTIONS: You have been given a prescription to be taken as directed for post-operative pain control.  You may also take over the counter ibuprofen/aleve and tylenol for pain. Take this as directed on the packaging. Do not exceed 3000 mg tylenol/acetaminophen in 24 hours.  Ibuprofen 600-800 mg (3-4) tablets by mouth every 6 hours as needed for pain.  OR Aleve 2 tablets by mouth every 12 hours (twice daily) as needed for pain.  AND/OR Tylenol 1000 mg (2 tablets) every 8 hours as needed for pain.  Please use your pain medication carefully, as refills are limited and you may not be provided with one.  As stated above, please use over the counter pain medicine - it will also be helpful with decreasing your swelling.    ANESTHESIA: After your surgery, post-surgical discomfort or pain is likely. This discomfort can last several days to a few weeks. At certain times of the day your discomfort may be more intense.   Did you receive a nerve block?  A nerve block can provide pain relief for one hour to two days after your surgery. As long as the nerve block is working, you will experience little or no sensation in the area the surgeon operated on.  As the nerve block wears off, you will begin to experience pain or discomfort. It is very important that you begin taking your prescribed pain medication before the nerve block fully wears off. Treating your pain at the first sign of the block wearing off will ensure your pain is better controlled and more tolerable when full-sensation returns. Do not wait until the pain is intolerable, as the medicine will be less effective. It is better to treat pain in advance than to try and catch up.   General Anesthesia:  If you did not receive a nerve block during your surgery, you will need to start taking your pain medication shortly after your surgery and should continue to do  so as prescribed by your surgeon.     ICE AND ELEVATION: Elevation, as much as possible for the next 48 hours, is critical for decreasing swelling as well as for pain relief. Elevation means when you are seated or lying down, you hand should be at or above your heart. When walking, the hand needs to be at or above the level of your elbow.  If the bandage gets too tight, it may need to be loosened. Please contact our office and we will instruct you in how to do this.    SURGICAL BANDAGES:  Keep your dressing and/or splint clean and dry at all times.  Do not remove until you are seen again in the office.  If careful, you may place a plastic bag over your bandage and tape the end to shower, but be careful, do not get your bandages wet.     HAND THERAPY:  You may not need any. If you do, we will begin this at your follow up visit in the clinic.    [facility] [address] [place],  [place]  [address] [phone]  
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
## 2021-06-26 NOTE — Anesthesia Procedure Notes (Signed)
Anesthesia Regional Block: Supraclavicular block   Pre-Anesthetic Checklist: , timeout performed,  Correct Patient, Correct Site, Correct Laterality,  Correct Procedure, Correct Position, site marked,  Risks and benefits discussed,  Pre-op evaluation,  At surgeon's request and post-op pain management  Laterality: Left  Prep: Maximum Sterile Barrier Precautions used, chloraprep       Needles:  Injection technique: Single-shot  Needle Type: Echogenic Stimulator Needle     Needle Length: 9cm  Needle Gauge: 22     Additional Needles:   Procedures:,,,, ultrasound used (permanent image in chart),,    Narrative:  Start time: 06/26/2021 7:01 PM End time: 06/26/2021 7:04 PM Injection made incrementally with aspirations every 5 mL.  Performed by: Personally  Anesthesiologist: Kaylyn Layer, MD  Additional Notes: Risks, benefits, and alternative discussed. Patient gave consent for procedure. Patient prepped and draped in sterile fashion. Sedation administered, patient remains easily responsive to voice. Relevant anatomy identified with ultrasound guidance. Local anesthetic given in 5cc increments with no signs or symptoms of intravascular injection. No pain or paraesthesias with injection. Patient monitored throughout procedure with signs of LAST or immediate complications. Tolerated well. Ultrasound image placed in chart.  Ryan Greenhouse, MD

## 2021-06-26 NOTE — Brief Op Note (Signed)
06/26/2021  9:38 PM  PATIENT:  Ryan Richard  46 y.o. male  PRE-OPERATIVE DIAGNOSIS:  LEFT THIRD AND FOURTH METACARPAL FRACTURE  POST-OPERATIVE DIAGNOSIS:  LEFT THIRD AND FOURTH METACARPAL FRACTURE  PROCEDURE:  Procedure(s) with comments: OPEN REDUCTION INTERNAL FIXATION OF THIRD AND FOURTH METACARPAL (Left) - Pre-operative Block  SURGEON:  Surgeon(s) and Role:    * Sherilyn Cooter, MD - Primary  PHYSICIAN ASSISTANT:   ASSISTANTS: none   ANESTHESIA:   regional and MAC  EBL:  50 mL   BLOOD ADMINISTERED:none  DRAINS: none   LOCAL MEDICATIONS USED:  NONE  SPECIMEN:  No Specimen  DISPOSITION OF SPECIMEN:  N/A  COUNTS:  YES  TOURNIQUET:   Total Tourniquet Time Documented: Upper Arm (Left) - 94 minutes Total: Upper Arm (Left) - 94 minutes   DICTATION: .Viviann Spare Dictation  PLAN OF CARE: Discharge from PACU  PATIENT DISPOSITION:  PACU - hemodynamically stable.   Delay start of Pharmacological VTE agent (>24hrs) due to surgical blood loss or risk of bleeding: not applicable

## 2021-06-26 NOTE — Anesthesia Postprocedure Evaluation (Signed)
Anesthesia Post Note  Patient: Ryan Richard  Procedure(s) Performed: OPEN REDUCTION INTERNAL FIXATION OF THIRD AND FOURTH METACARPAL (Left: Finger)     Patient location during evaluation: PACU Anesthesia Type: Regional Level of consciousness: awake and alert Pain management: pain level controlled Vital Signs Assessment: post-procedure vital signs reviewed and stable Respiratory status: spontaneous breathing, nonlabored ventilation and respiratory function stable Cardiovascular status: blood pressure returned to baseline Postop Assessment: no apparent nausea or vomiting Anesthetic complications: no   No notable events documented.  Last Vitals:  Vitals:   06/26/21 2200 06/26/21 2215  BP: 134/89 (!) 152/96  Pulse: 60 71  Resp: 12 11  Temp: (!) 36.1 C (!) 36.4 C  SpO2: 100% 100%    Last Pain:  Vitals:   06/26/21 2200  PainSc: 0-No pain                 Shanda Howells

## 2021-06-26 NOTE — ED Notes (Signed)
Patient and significant other updated on plan of care and surgery time. Patient requesting pain medication, MD notified.
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
## 2021-06-26 NOTE — ED Notes (Addendum)
Saturated wound dressing removed and redressed. Patient's watch remains on L wrist. Offered to assist with removal. Patient states he will remove watch himself.

## 2021-06-27 ENCOUNTER — Other Ambulatory Visit: Payer: Self-pay | Admitting: Physician Assistant

## 2021-06-28 ENCOUNTER — Encounter (HOSPITAL_COMMUNITY): Payer: Self-pay | Admitting: Orthopedic Surgery
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
## 2021-06-29 ENCOUNTER — Other Ambulatory Visit: Payer: Self-pay

## 2021-06-29 ENCOUNTER — Encounter: Payer: Self-pay | Admitting: Orthopedic Surgery

## 2021-06-29 ENCOUNTER — Ambulatory Visit (INDEPENDENT_AMBULATORY_CARE_PROVIDER_SITE_OTHER): Payer: Self-pay | Admitting: Orthopedic Surgery

## 2021-06-29 DIAGNOSIS — S62323B Displaced fracture of shaft of third metacarpal bone, left hand, initial encounter for open fracture: Secondary | ICD-10-CM

## 2021-06-29 DIAGNOSIS — S62325B Displaced fracture of shaft of fourth metacarpal bone, left hand, initial encounter for open fracture: Secondary | ICD-10-CM
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
## 2021-07-02 ENCOUNTER — Ambulatory Visit (INDEPENDENT_AMBULATORY_CARE_PROVIDER_SITE_OTHER): Payer: Self-pay

## 2021-07-02 ENCOUNTER — Other Ambulatory Visit: Payer: Self-pay

## 2021-07-02 ENCOUNTER — Ambulatory Visit (INDEPENDENT_AMBULATORY_CARE_PROVIDER_SITE_OTHER): Payer: Self-pay | Admitting: Orthopedic Surgery

## 2021-07-02 DIAGNOSIS — S62325B Displaced fracture of shaft of fourth metacarpal bone, left hand, initial encounter for open fracture: Secondary | ICD-10-CM

## 2021-07-02 DIAGNOSIS — S61239A Puncture wound without foreign body of unspecified finger without damage to nail, initial encounter: Secondary | ICD-10-CM

## 2021-07-02 DIAGNOSIS — Z9889 Other specified postprocedural states: Secondary | ICD-10-CM

## 2021-07-02 DIAGNOSIS — Z8781 Personal history of (healed) traumatic fracture: Secondary | ICD-10-CM

## 2021-07-02 DIAGNOSIS — S62323B Displaced fracture of shaft of third metacarpal bone, left hand, initial encounter for open fracture: Secondary | ICD-10-CM
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
## 2021-07-03 ENCOUNTER — Other Ambulatory Visit: Payer: Self-pay | Admitting: Orthopedic Surgery

## 2021-07-03 MED ORDER — OXYCODONE HCL 5 MG PO TABS: 5.0000 mg | ORAL_TABLET | Freq: Four times a day (QID) | ORAL | 0 refills | Status: DC | PRN

## 2021-07-08 ENCOUNTER — Other Ambulatory Visit: Payer: Self-pay

## 2021-07-08 ENCOUNTER — Ambulatory Visit (INDEPENDENT_AMBULATORY_CARE_PROVIDER_SITE_OTHER): Payer: Self-pay | Admitting: Orthopedic Surgery

## 2021-07-08 DIAGNOSIS — S62323B Displaced fracture of shaft of third metacarpal bone, left hand, initial encounter for open fracture: Secondary | ICD-10-CM

## 2021-07-08 DIAGNOSIS — S62325B Displaced fracture of shaft of fourth metacarpal bone, left hand, initial encounter for open fracture: Secondary | ICD-10-CM

## 2021-07-08 MED ORDER — OXYCODONE HCL 5 MG PO TABS: 5.0000 mg | ORAL_TABLET | Freq: Four times a day (QID) | ORAL | 0 refills | Status: AC | PRN

## 2021-07-08 MED ORDER — SULFAMETHOXAZOLE-TRIMETHOPRIM 800-160 MG PO TABS: 1.0000 | ORAL_TABLET | Freq: Two times a day (BID) | ORAL | 0 refills | Status: AC
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
## 2021-07-13 ENCOUNTER — Encounter: Payer: Self-pay | Admitting: Orthopedic Surgery

## 2021-07-13 ENCOUNTER — Other Ambulatory Visit: Payer: Self-pay

## 2021-07-13 ENCOUNTER — Ambulatory Visit: Payer: Self-pay

## 2021-07-13 ENCOUNTER — Ambulatory Visit (INDEPENDENT_AMBULATORY_CARE_PROVIDER_SITE_OTHER): Payer: Self-pay | Admitting: Orthopedic Surgery

## 2021-07-13 DIAGNOSIS — S62325B Displaced fracture of shaft of fourth metacarpal bone, left hand, initial encounter for open fracture: Secondary | ICD-10-CM

## 2021-07-13 DIAGNOSIS — S62323B Displaced fracture of shaft of third metacarpal bone, left hand, initial encounter for open fracture: Secondary | ICD-10-CM

## 2021-07-15 ENCOUNTER — Ambulatory Visit: Payer: Self-pay | Admitting: Orthopedic Surgery

## 2021-07-16 ENCOUNTER — Other Ambulatory Visit: Payer: Self-pay

## 2021-07-16 ENCOUNTER — Ambulatory Visit (INDEPENDENT_AMBULATORY_CARE_PROVIDER_SITE_OTHER): Payer: Self-pay | Admitting: Orthopedic Surgery

## 2021-07-16 DIAGNOSIS — S62323B Displaced fracture of shaft of third metacarpal bone, left hand, initial encounter for open fracture: Secondary | ICD-10-CM

## 2021-07-16 DIAGNOSIS — S62325B Displaced fracture of shaft of fourth metacarpal bone, left hand, initial encounter for open fracture: Secondary | ICD-10-CM
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
## 2021-07-19 ENCOUNTER — Encounter: Payer: Self-pay | Admitting: Rehabilitative and Restorative Service Providers"

## 2021-07-21 ENCOUNTER — Encounter: Payer: Self-pay | Admitting: Rehabilitative and Restorative Service Providers"
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
## 2021-07-22 ENCOUNTER — Ambulatory Visit (INDEPENDENT_AMBULATORY_CARE_PROVIDER_SITE_OTHER): Payer: Self-pay | Admitting: Orthopedic Surgery

## 2021-07-22 ENCOUNTER — Ambulatory Visit: Payer: Self-pay

## 2021-07-22 ENCOUNTER — Encounter: Payer: Self-pay | Admitting: Orthopedic Surgery

## 2021-07-22 ENCOUNTER — Other Ambulatory Visit: Payer: Self-pay

## 2021-07-22 DIAGNOSIS — S62325B Displaced fracture of shaft of fourth metacarpal bone, left hand, initial encounter for open fracture: Secondary | ICD-10-CM

## 2021-07-22 DIAGNOSIS — S62323B Displaced fracture of shaft of third metacarpal bone, left hand, initial encounter for open fracture: Secondary | ICD-10-CM
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
## 2021-07-26 ENCOUNTER — Encounter: Payer: Self-pay | Admitting: Rehabilitative and Restorative Service Providers"

## 2021-07-26 ENCOUNTER — Ambulatory Visit (INDEPENDENT_AMBULATORY_CARE_PROVIDER_SITE_OTHER): Payer: Self-pay | Admitting: Rehabilitative and Restorative Service Providers"

## 2021-07-26 ENCOUNTER — Other Ambulatory Visit: Payer: Self-pay

## 2021-07-26 DIAGNOSIS — R278 Other lack of coordination: Secondary | ICD-10-CM

## 2021-07-26 DIAGNOSIS — R600 Localized edema: Secondary | ICD-10-CM

## 2021-07-26 DIAGNOSIS — M79642 Pain in left hand: Secondary | ICD-10-CM

## 2021-07-26 DIAGNOSIS — M6281 Muscle weakness (generalized): Secondary | ICD-10-CM
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
## 2021-07-27 ENCOUNTER — Ambulatory Visit: Payer: Self-pay

## 2021-07-27 ENCOUNTER — Ambulatory Visit (INDEPENDENT_AMBULATORY_CARE_PROVIDER_SITE_OTHER): Payer: Self-pay | Admitting: Orthopedic Surgery

## 2021-07-27 DIAGNOSIS — S62325B Displaced fracture of shaft of fourth metacarpal bone, left hand, initial encounter for open fracture: Secondary | ICD-10-CM

## 2021-07-27 DIAGNOSIS — S61432A Puncture wound without foreign body of left hand, initial encounter: Secondary | ICD-10-CM

## 2021-07-27 DIAGNOSIS — S62323B Displaced fracture of shaft of third metacarpal bone, left hand, initial encounter for open fracture: Secondary | ICD-10-CM
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
## 2021-08-03 ENCOUNTER — Encounter: Payer: Self-pay | Admitting: Orthopedic Surgery

## 2021-08-23 ENCOUNTER — Ambulatory Visit: Payer: Self-pay | Admitting: Physician Assistant

## 2021-08-23 ENCOUNTER — Telehealth: Payer: Self-pay | Admitting: Orthopedic Surgery

## 2021-08-23 NOTE — Telephone Encounter (Signed)
I put him on the schedule @ 1pm to see Lillia Abed today--so she can at least evaluate him. ?

## 2021-08-23 NOTE — Telephone Encounter (Signed)
Pt called stating need to be seen had surgery 3/11 and hand is swollen with a rash, and pained. Please call pt at  ?

## 2021-08-24 ENCOUNTER — Ambulatory Visit (INDEPENDENT_AMBULATORY_CARE_PROVIDER_SITE_OTHER): Payer: Self-pay | Admitting: Orthopedic Surgery

## 2021-08-24 ENCOUNTER — Ambulatory Visit (INDEPENDENT_AMBULATORY_CARE_PROVIDER_SITE_OTHER): Payer: Self-pay

## 2021-08-24 DIAGNOSIS — S62325B Displaced fracture of shaft of fourth metacarpal bone, left hand, initial encounter for open fracture: Secondary | ICD-10-CM

## 2021-08-24 DIAGNOSIS — S62323B Displaced fracture of shaft of third metacarpal bone, left hand, initial encounter for open fracture: Secondary | ICD-10-CM
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
## 2021-08-30 ENCOUNTER — Encounter: Payer: Self-pay | Admitting: Rehabilitative and Restorative Service Providers"

## 2021-08-30 ENCOUNTER — Telehealth: Payer: Self-pay | Admitting: Rehabilitative and Restorative Service Providers"
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
## 2021-08-30 NOTE — Telephone Encounter (Signed)
I called and spoke to Ryan Richard at 1:50pm today, and offered him to come early to his 4pm appointment. He told me that he still has a wound, his bones are not quite healed, he has not been wearing his orthotic consistently. He states it is painful to wear at times. He states he will "see me at 4pm," and then he did not show up to that appointment.  ? ?Due to non-compliance with attending therapy as asked (he has not been seen in last 5 weeks), OT will not call or reschedule his appointment. It's up to him. If he doesn't call to reschedule or come in to therapy in the next 2 weeks, OT will discharge him for non-compliance.  ?

## 2021-09-01 ENCOUNTER — Ambulatory Visit (INDEPENDENT_AMBULATORY_CARE_PROVIDER_SITE_OTHER): Payer: Self-pay | Admitting: Rehabilitative and Restorative Service Providers"

## 2021-09-01 ENCOUNTER — Encounter: Payer: Self-pay | Admitting: Rehabilitative and Restorative Service Providers"

## 2021-09-01 DIAGNOSIS — M79642 Pain in left hand: Secondary | ICD-10-CM

## 2021-09-01 DIAGNOSIS — R278 Other lack of coordination: Secondary | ICD-10-CM

## 2021-09-01 DIAGNOSIS — M6281 Muscle weakness (generalized): Secondary | ICD-10-CM

## 2021-09-01 DIAGNOSIS — R600 Localized edema: Secondary | ICD-10-CM
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
## 2021-09-09 ENCOUNTER — Encounter: Payer: Self-pay | Admitting: Rehabilitative and Restorative Service Providers"

## 2021-09-09 ENCOUNTER — Ambulatory Visit (INDEPENDENT_AMBULATORY_CARE_PROVIDER_SITE_OTHER): Payer: Self-pay | Admitting: Rehabilitative and Restorative Service Providers"

## 2021-09-09 ENCOUNTER — Ambulatory Visit (INDEPENDENT_AMBULATORY_CARE_PROVIDER_SITE_OTHER): Payer: Self-pay | Admitting: Orthopedic Surgery

## 2021-09-09 DIAGNOSIS — M79642 Pain in left hand: Secondary | ICD-10-CM

## 2021-09-09 DIAGNOSIS — R600 Localized edema: Secondary | ICD-10-CM

## 2021-09-09 DIAGNOSIS — S61432A Puncture wound without foreign body of left hand, initial encounter: Secondary | ICD-10-CM

## 2021-09-09 DIAGNOSIS — M6281 Muscle weakness (generalized): Secondary | ICD-10-CM

## 2021-09-09 DIAGNOSIS — S62325B Displaced fracture of shaft of fourth metacarpal bone, left hand, initial encounter for open fracture: Secondary | ICD-10-CM

## 2021-09-09 DIAGNOSIS — S62323B Displaced fracture of shaft of third metacarpal bone, left hand, initial encounter for open fracture: Secondary | ICD-10-CM

## 2021-09-09 DIAGNOSIS — R278 Other lack of coordination: Secondary | ICD-10-CM
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
## 2021-09-16 ENCOUNTER — Encounter: Payer: Self-pay | Admitting: Rehabilitative and Restorative Service Providers"
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
## 2021-09-21 ENCOUNTER — Ambulatory Visit (INDEPENDENT_AMBULATORY_CARE_PROVIDER_SITE_OTHER): Payer: 59 | Admitting: Orthopedic Surgery

## 2021-09-21 DIAGNOSIS — S61432A Puncture wound without foreign body of left hand, initial encounter: Secondary | ICD-10-CM

## 2021-09-21 DIAGNOSIS — S62325B Displaced fracture of shaft of fourth metacarpal bone, left hand, initial encounter for open fracture: Secondary | ICD-10-CM

## 2021-09-21 DIAGNOSIS — S62323B Displaced fracture of shaft of third metacarpal bone, left hand, initial encounter for open fracture: Secondary | ICD-10-CM
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
## 2021-09-23 ENCOUNTER — Encounter: Payer: 59 | Admitting: Rehabilitative and Restorative Service Providers"
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
## 2021-10-05 ENCOUNTER — Ambulatory Visit: Payer: 59 | Admitting: Orthopedic Surgery

## 2022-05-02 ENCOUNTER — Emergency Department (HOSPITAL_COMMUNITY)
Admission: EM | Admit: 2022-05-02 | Discharge: 2022-05-02 | Disposition: A | Discharge status: Home / Self Care | Payer: Self-pay | Attending: Emergency Medicine | Admitting: Emergency Medicine

## 2022-05-02 ENCOUNTER — Other Ambulatory Visit: Payer: Self-pay

## 2022-05-02 ENCOUNTER — Emergency Department (HOSPITAL_COMMUNITY): Payer: Self-pay

## 2022-05-02 ENCOUNTER — Encounter (HOSPITAL_COMMUNITY): Payer: Self-pay

## 2022-05-02 DIAGNOSIS — E1065 Type 1 diabetes mellitus with hyperglycemia: Secondary | ICD-10-CM | POA: Insufficient documentation

## 2022-05-02 DIAGNOSIS — R1013 Epigastric pain: Secondary | ICD-10-CM | POA: Insufficient documentation

## 2022-05-02 DIAGNOSIS — R739 Hyperglycemia, unspecified: Secondary | ICD-10-CM

## 2022-05-02 LAB — URINALYSIS, ROUTINE W REFLEX MICROSCOPIC
Bacteria, UA: NONE SEEN
Bilirubin Urine: NEGATIVE
Glucose, UA: >=500 mg/dL — AB
Hgb urine dipstick: NEGATIVE
Ketones, ur: NEGATIVE mg/dL
Leukocytes,Ua: NEGATIVE
Nitrite: NEGATIVE
Protein, ur: NEGATIVE mg/dL
Specific Gravity, Urine: 1.028 (ref 1.005–1.030)
pH: 5 (ref 5.0–8.0)

## 2022-05-02 LAB — COMPREHENSIVE METABOLIC PANEL
ALT: 81 U/L — ABNORMAL HIGH (ref 0–44)
AST: 79 U/L — ABNORMAL HIGH (ref 15–41)
Albumin: 3.6 g/dL (ref 3.5–5.0)
Alkaline Phosphatase: 144 U/L — ABNORMAL HIGH (ref 38–126)
Anion gap: 11 (ref 5–15)
BUN: 18 mg/dL (ref 6–20)
CO2: 22 mmol/L (ref 22–32)
Calcium: 9.3 mg/dL (ref 8.9–10.3)
Chloride: 91 mmol/L — ABNORMAL LOW (ref 98–111)
Creatinine, Ser: 1.01 mg/dL (ref 0.61–1.24)
GFR, Estimated: >60 mL/min (ref >60)
Glucose, Bld: 728 mg/dL (ref 70–99)
Potassium: 3.9 mmol/L (ref 3.5–5.1)
Sodium: 124 mmol/L — ABNORMAL LOW (ref 135–145)
Total Bilirubin: 0.7 mg/dL (ref 0.3–1.2)
Total Protein: 6.9 g/dL (ref 6.5–8.1)

## 2022-05-02 LAB — TYPE AND SCREEN
ABO/RH(D): O NEG
Antibody Screen: NEGATIVE

## 2022-05-02 LAB — BLOOD GAS, VENOUS
Acid-Base Excess: 1.5 mmol/L (ref 0.0–2.0)
Bicarbonate: 26.6 mmol/L (ref 20.0–28.0)
O2 Saturation: 81.6 %
Patient temperature: 36.1
pCO2, Ven: 41 mmHg — ABNORMAL LOW (ref 44–60)
pH, Ven: 7.41 (ref 7.25–7.43)
pO2, Ven: 43 mmHg (ref 32–45)

## 2022-05-02 LAB — CBG MONITORING, ED
Glucose-Capillary: >600 mg/dL (ref 70–99)
Glucose-Capillary: 550 mg/dL (ref 70–99)
Glucose-Capillary: 337 mg/dL — ABNORMAL HIGH (ref 70–99)

## 2022-05-02 LAB — BETA-HYDROXYBUTYRIC ACID: Beta-Hydroxybutyric Acid: 0.19 mmol/L (ref 0.05–0.27)

## 2022-05-02 LAB — CBC
HCT: 44.8 % (ref 39.0–52.0)
Hemoglobin: 14.8 g/dL (ref 13.0–17.0)
MCH: 27.4 pg (ref 26.0–34.0)
MCHC: 33 g/dL (ref 30.0–36.0)
MCV: 82.8 fL (ref 80.0–100.0)
Platelets: 270 10*3/uL (ref 150–400)
RBC: 5.41 MIL/uL (ref 4.22–5.81)
RDW: 12.9 % (ref 11.5–15.5)
WBC: 8.3 10*3/uL (ref 4.0–10.5)
nRBC: 0 % (ref 0.0–0.2)

## 2022-05-02 LAB — LIPASE, BLOOD: Lipase: 27 U/L (ref 11–51)

## 2022-05-02 MED ORDER — SODIUM CHLORIDE 0.9 % IV BOLUS
1000.0000 mL | Freq: Once | INTRAVENOUS | Status: AC
  Administered 2022-05-02: 1000 mL via INTRAVENOUS

## 2022-05-02 MED ORDER — SODIUM CHLORIDE (PF) 0.9 % IJ SOLN
INTRAMUSCULAR | Status: AC
  Filled 2022-05-02: qty 50

## 2022-05-02 MED ORDER — ONDANSETRON 4 MG PO TBDP: ORAL_TABLET | ORAL | 1 refills | Status: DC

## 2022-05-02 MED ORDER — PANTOPRAZOLE SODIUM 40 MG IV SOLR
40.0000 mg | Freq: Once | INTRAVENOUS | Status: AC
  Administered 2022-05-02: 40 mg via INTRAVENOUS
  Filled 2022-05-02: qty 10

## 2022-05-02 MED ORDER — IOHEXOL 300 MG/ML  SOLN
100.0000 mL | Freq: Once | INTRAMUSCULAR | Status: AC | PRN
  Administered 2022-05-02: 100 mL via INTRAVENOUS

## 2022-05-02 MED ORDER — LACTATED RINGERS IV BOLUS: 20.0000 mL/kg | Freq: Once | INTRAVENOUS | Status: DC

## 2022-05-02 MED ORDER — INSULIN ASPART 100 UNIT/ML IJ SOLN
10.0000 [IU] | INTRAMUSCULAR | Status: AC
  Administered 2022-05-02: 10 [IU] via INTRAVENOUS
  Filled 2022-05-02: qty 0.1

## 2022-05-02 NOTE — ED Provider Triage Note (Signed)
Emergency Medicine Provider Triage Evaluation Note  Ryan Richard , a 46 y.o. male  was evaluated in triage.  Pt complains of abdominal pain and vomiting.  Patient reports he has had R sided chest pain and ground coffee emesis in the last 7 days.  Patient states the pain is similar to stomach ulcer pain that he had in the past.  History of type 1 diabetes.  CBG in triage over 600.  Review of Systems  Positive: As above Negative: As above  Physical Exam  BP (!) 143/101 (BP Location: Right Arm)   Pulse 99   Temp 98.2 F (36.8 C) (Oral)   Resp 16   Ht 5\' 10"  (1.778 m)   Wt 77.1 kg   SpO2 100%   BMI 24.39 kg/m  Gen:   Awake, no distress   Resp:  Normal effort  MSK:   Moves extremities without difficulty  Other:    Medical Decision Making  Medically screening exam initiated at 11:15 AM.  Appropriate orders placed.  Ryan Richard was informed that the remainder of the evaluation will be completed by another provider, this initial triage assessment does not replace that evaluation, and the importance of remaining in the ED until their evaluation is complete.     Ryan Richard, Ryan Richard 05/02/22 2014

## 2022-05-02 NOTE — ED Provider Notes (Signed)
Patient no longer having vomiting.  His glucose has improved with treatment.  His ultrasound was unremarkable along with his CT scan.  Patient does have elevated liver enzymes.  He is referred to GI and referred to primary care doctor and given Zofran   Bethann Berkshire, MD 05/02/22 1805

## 2022-05-02 NOTE — ED Triage Notes (Signed)
Patient reports that he has been having coffee ground emesis, right chest pain x 1 weeks. Patient states he has been drinking sugary drinks to help with N/V and now his sugar is high.  CBG in triage-read HIGH.

## 2022-05-02 NOTE — ED Notes (Signed)
An After Visit Summary was printed and given to the patient. Discharge instructions given and no further questions at this time.  

## 2022-05-02 NOTE — Discharge Instructions (Signed)
Follow-up with Dr. Dulce Sellar or one of his colleagues for your elevated liver enzymes.  And also follow-up to get a family practice doctor you have been referred to [facility] and wellness

## 2022-05-02 NOTE — ED Provider Notes (Signed)
[facility] DEPT Provider Note   CSN: [id number] Arrival date & time: 05/02/22  1032     History  Chief Complaint  Patient presents with   Hematemesis   Hyperglycemia    Ryan Richard is a 46 y.o. male.  HPI   46 year old male history of type 1 diabetes and gastritis presents today with nausea and vomiting and abdominal pain.  He states that he has been having pain in his epigastrium to his right upper quadrant with any p.o. intake for the past week.  He reports some coffee-ground looking emesis usually about 2-3 times per day.  He has been drinking sugary drinks and his blood sugar has been reading high.  Reports he had a similar episode with gastritis several years ago and was seen in the hospital at [facility].  He has not followed up regarding this.  He denies any alcohol use, ibuprofen, or acetaminophen use.  Home Medications Prior to Admission medications   Medication Sig Start Date End Date Taking? Authorizing Provider  insulin NPH-regular Human (70-30) 100 UNIT/ML injection Inject 25 Units into the skin 2 (two) times daily with a meal.    [provider]  naproxen (NAPROSYN) 500 MG tablet Take 1 tablet (500 mg total) by mouth 2 (two) times daily. Patient taking differently: Take 500 mg by mouth 2 (two) times daily as needed for moderate pain. 01/07/16   Varney Biles, MD  ondansetron (ZOFRAN ODT) 4 MG disintegrating tablet Take 1 tablet (4 mg total) by mouth every 8 (eight) hours as needed for nausea. Patient not taking: Reported on 02/15/2019 05/25/14   Tanna Furry, MD      Allergies    Penicillins    Review of Systems   Review of Systems  Physical Exam Updated Vital Signs BP 115/89   Pulse 82   Temp 97.7 F (36.5 C) (Oral)   Resp 14   Ht 1.778 m (_0 )   Wt 77.1 kg   SpO2 98%   BMI 24.39 kg/m  Physical Exam Vitals and nursing note reviewed.  Constitutional:      Appearance: Normal appearance.  HENT:     Head:  Normocephalic.     Right Ear: External ear normal.     Left Ear: External ear normal.     Nose: Nose normal.     Mouth/Throat:     Pharynx: Oropharynx is clear.  Eyes:     Pupils: Pupils are equal, round, and reactive to light.  Cardiovascular:     Rate and Rhythm: Normal rate and regular rhythm.     Pulses: Normal pulses.     Heart sounds: Normal heart sounds.  Pulmonary:     Effort: Pulmonary effort is normal.     Breath sounds: Normal breath sounds.  Abdominal:     General: Abdomen is flat.     Palpations: Abdomen is soft.     Tenderness: There is abdominal tenderness.     Comments: Some epigastric and right upper quadrant tenderness to palpation  Musculoskeletal:        General: Normal range of motion.     Cervical back: Normal range of motion.  Skin:    General: Skin is warm.     Capillary Refill: Capillary refill takes less than 2 seconds.  Neurological:     General: No focal deficit present.     Mental Status: He is alert.  Psychiatric:        Mood and Affect: Mood normal.  ED Results / Procedures / Treatments   Labs (all labs ordered are listed, but only abnormal results are displayed) Labs Reviewed  COMPREHENSIVE METABOLIC PANEL - Abnormal; Notable for the following components:      Result Value   Sodium 124 (*)    Chloride 91 (*)    Glucose, Bld 728 (*)    AST 79 (*)    ALT 81 (*)    Alkaline Phosphatase 144 (*)    All other components within normal limits  URINALYSIS, ROUTINE W REFLEX MICROSCOPIC - Abnormal; Notable for the following components:   Color, Urine COLORLESS (*)    Glucose, UA >=500 (*)    All other components within normal limits  BLOOD GAS, VENOUS - Abnormal; Notable for the following components:   pCO2, Ven 41 (*)    All other components within normal limits  CBG MONITORING, ED - Abnormal; Notable for the following components:   Glucose-Capillary >600 (*)    All other components within normal limits  CBG MONITORING, ED - Abnormal;  Notable for the following components:   Glucose-Capillary 550 (*)    All other components within normal limits  CBG MONITORING, ED - Abnormal; Notable for the following components:   Glucose-Capillary 337 (*)    All other components within normal limits  CBC  BETA-HYDROXYBUTYRIC ACID  LIPASE, BLOOD  BETA-HYDROXYBUTYRIC ACID  BETA-HYDROXYBUTYRIC ACID  CBG MONITORING, ED  POC OCCULT BLOOD, ED  TYPE AND SCREEN    EKG EKG Interpretation  Date/Time:  Monday May 02 2022 11:35:04 EST Ventricular Rate:  101 PR Interval:  143 QRS Duration: 92 QT Interval:  339 QTC Calculation: 440 R Axis:   75 Text Interpretation: Sinus tachycardia Biatrial enlargement Confirmed by Pattricia Boss [phone]) on 05/02/2022 11:47:28 AM  Radiology US Abdomen Limited RUQ (LIVER/GB)  Result Date: 05/02/2022 CLINICAL DATA:  Acute right upper quadrant abdominal pain. EXAM: ULTRASOUND ABDOMEN LIMITED RIGHT UPPER QUADRANT COMPARISON:  CT scan of today. FINDINGS: Gallbladder: No gallstones or wall thickening visualized. No sonographic Murphy sign noted by sonographer. Common bile duct: Diameter: 4 mm which is within normal limits. Liver: No focal lesion identified. Within normal limits in parenchymal echogenicity. Portal vein is patent on color Doppler imaging with normal direction of blood flow towards the liver. Other: None. IMPRESSION: No abnormality seen in the right upper quadrant of the abdomen. Electronically Signed   By: Marijo Conception M.D.   On: 05/02/2022 16:45   CT ABDOMEN PELVIS W CONTRAST  Result Date: 05/02/2022 CLINICAL DATA:  Chest pain with coffee-ground emesis 1 week. EXAM: CT CHEST, ABDOMEN, AND PELVIS WITH CONTRAST TECHNIQUE: Multidetector CT imaging of the chest, abdomen and pelvis was performed following the standard protocol during bolus administration of intravenous contrast. RADIATION DOSE REDUCTION: This exam was performed according to the departmental dose-optimization program which  includes automated exposure control, adjustment of the mA and/or kV according to patient size and/or use of iterative reconstruction technique. CONTRAST:  125m OMNIPAQUE IOHEXOL 300 MG/ML  SOLN COMPARISON:  None Available. FINDINGS: CT CHEST FINDINGS Cardiovascular: Heart size is normal. Suggestion of subtle calcified plaque over the left anterior descending and right coronary arteries. Tiny amount of pericardial fluid is present. Thoracic aorta is normal in caliber. Remaining vascular structures are normal. Mediastinum/Nodes: No evidence of mediastinal or hilar adenopathy. Remaining mediastinal structures are normal. Lungs/Pleura: The lungs are adequately inflated without focal airspace consolidation or effusion. Minimal bronchiectatic change of the medial lower lobes bilaterally. The airways are otherwise unremarkable. Musculoskeletal: No  focal abnormality.  Old left rib fractures for CT ABDOMEN PELVIS FINDINGS Hepatobiliary: Gallbladder is contracted. Liver and biliary tree are otherwise unremarkable. Pancreas: Normal. Spleen: Normal. Adrenals/Urinary Tract: Adrenal glands are normal. Kidneys are normal size with a 1.1 cm cyst over the midpole right kidney. No evidence of hydronephrosis or nephrolithiasis. Ureters and bladder normal. Stomach/Bowel: Stomach and small bowel are normal. Appendix not visualized. Colon is within normal for mild fecal retention throughout the colon. Vascular/Lymphatic: Abdominal aorta is normal caliber with minimal calcified plaque near the bifurcation. No adenopathy. Reproductive: Normal. Other: No free fluid or focal inflammatory change. Musculoskeletal: No focal abnormality. IMPRESSION: 1. No acute findings in the chest, abdomen or pelvis. 2. 1.1 cm right renal cyst. 3. Aortic atherosclerosis. Minimal atherosclerotic coronary artery disease. Aortic Atherosclerosis (ICD10-I70.0). Electronically Signed   By: Marin Olp M.D.   On: 05/02/2022 13:46   CT Chest W Contrast  Result  Date: 05/02/2022 CLINICAL DATA:  Chest pain with coffee-ground emesis 1 week. EXAM: CT CHEST, ABDOMEN, AND PELVIS WITH CONTRAST TECHNIQUE: Multidetector CT imaging of the chest, abdomen and pelvis was performed following the standard protocol during bolus administration of intravenous contrast. RADIATION DOSE REDUCTION: This exam was performed according to the departmental dose-optimization program which includes automated exposure control, adjustment of the mA and/or kV according to patient size and/or use of iterative reconstruction technique. CONTRAST:  139m OMNIPAQUE IOHEXOL 300 MG/ML  SOLN COMPARISON:  None Available. FINDINGS: CT CHEST FINDINGS Cardiovascular: Heart size is normal. Suggestion of subtle calcified plaque over the left anterior descending and right coronary arteries. Tiny amount of pericardial fluid is present. Thoracic aorta is normal in caliber. Remaining vascular structures are normal. Mediastinum/Nodes: No evidence of mediastinal or hilar adenopathy. Remaining mediastinal structures are normal. Lungs/Pleura: The lungs are adequately inflated without focal airspace consolidation or effusion. Minimal bronchiectatic change of the medial lower lobes bilaterally. The airways are otherwise unremarkable. Musculoskeletal: No focal abnormality.  Old left rib fractures for CT ABDOMEN PELVIS FINDINGS Hepatobiliary: Gallbladder is contracted. Liver and biliary tree are otherwise unremarkable. Pancreas: Normal. Spleen: Normal. Adrenals/Urinary Tract: Adrenal glands are normal. Kidneys are normal size with a 1.1 cm cyst over the midpole right kidney. No evidence of hydronephrosis or nephrolithiasis. Ureters and bladder normal. Stomach/Bowel: Stomach and small bowel are normal. Appendix not visualized. Colon is within normal for mild fecal retention throughout the colon. Vascular/Lymphatic: Abdominal aorta is normal caliber with minimal calcified plaque near the bifurcation. No adenopathy. Reproductive:  Normal. Other: No free fluid or focal inflammatory change. Musculoskeletal: No focal abnormality. IMPRESSION: 1. No acute findings in the chest, abdomen or pelvis. 2. 1.1 cm right renal cyst. 3. Aortic atherosclerosis. Minimal atherosclerotic coronary artery disease. Aortic Atherosclerosis (ICD10-I70.0). Electronically Signed   By: DMarin OlpM.D.   On: 05/02/2022 13:46   DG Chest Portable 1 View  Result Date: 05/02/2022 CLINICAL DATA:  Hematemesis. Coffee ground emesis. Right chest pain. EXAM: PORTABLE CHEST 1 VIEW COMPARISON:  None Available. FINDINGS: Right lateral costophrenic angle excluded from the film. Heart size is normal. Surgical clip projects over the mediastinum, probably related to clipping of a PDA. The lungs are clear. The vascularity is normal. No visible effusion. Chronic postoperative changes of the left ribs. No acute bone finding. IMPRESSION: No active disease. Surgical clip projects over the mediastinum, possibly related to clipping of a PDA. Electronically Signed   By: MNelson ChimesM.D.   On: 05/02/2022 11:24    Procedures Procedures    Medications Ordered in ED  Medications  sodium chloride 0.9 % bolus 1,000 mL (1,000 mLs Intravenous New Bag/Given 05/02/22 1304)  pantoprazole (PROTONIX) injection 40 mg (40 mg Intravenous Given 05/02/22 1303)  iohexol (OMNIPAQUE) 300 MG/ML solution 100 mL (100 mLs Intravenous Contrast Given 05/02/22 1314)  sodium chloride (PF) 0.9 % injection (  Given by Other 05/02/22 1300)  insulin aspart (novoLOG) injection 10 Units (10 Units Intravenous Given 05/02/22 1520)    ED Course/ Medical Decision Making/ A&P Clinical Course as of 05/02/22 1714  Mon May 02, 2022  1105 Glucose-Capillary(!!): >600 [JL]  1436 CT ABDOMEN PELVIS W CONTRAST [DR]  6759 CT chest abdomen pelvis reviewed and interpreted with no evidence of acute abnormalities found and radiologist interpretation concurs [DR]  1437 CBC reviewed interpreted significant for normal  white blood cell count [DR]  1437 , normal hemoglobin at 14.8 and normal platelets at 270,000 [DR]  1638 Complete metabolic panel reviewed interpreted significant for sodium 124, glucose 128, transaminitis with AST 79, ALT 81, alk phos 144 Normal bilirubin at 0.7 [DR]  1437 Patient received IV fluids [DR]    Clinical Course User Index [DR] Pattricia Boss, MD [JL] Regan Lemming, MD                           Medical Decision Making 46 year old male history of type 1 diabetes presents today with 46 year old male with epigastric right upper quadrant pain with eating over the past week.  He has had ongoing emesis.  He thought that it was coffee-ground. CT obtained shows no evidence of acute abnormality Labs are significant for elevated LFTs Normal white blood cell count Normal lipase Plan ultrasound right upper quadrant 1-abdominal pain-differential diagnosis includes but is not limited to gastritis,, versus pancreatitis, versus cholecystitis, versus hepatitis 2-To type I diabetic with hyperglycemia.  No evidence of DKA.  Patient received IV fluids and insulin and blood sugar has decreased Care discussed with Dr. Roderic Palau who will disposition after   Amount and/or Complexity of Data Reviewed Labs: ordered. Decision-making details documented in ED Course. Radiology: ordered and independent interpretation performed. Decision-making details documented in ED Course.  Risk Prescription drug management.           Final Clinical Impression(s) / ED Diagnoses Final diagnoses:  Epigastric pain  Hyperglycemia    Rx / DC Orders ED Discharge Orders     None         Pattricia Boss, MD 05/02/22 1714

## 2023-01-05 ENCOUNTER — Ambulatory Visit (HOSPITAL_BASED_OUTPATIENT_CLINIC_OR_DEPARTMENT_OTHER): Payer: Self-pay | Admitting: Family Medicine

## 2023-01-06 ENCOUNTER — Encounter (HOSPITAL_BASED_OUTPATIENT_CLINIC_OR_DEPARTMENT_OTHER): Payer: Self-pay | Admitting: Family Medicine

## 2023-01-06 ENCOUNTER — Ambulatory Visit (INDEPENDENT_AMBULATORY_CARE_PROVIDER_SITE_OTHER): Payer: Self-pay | Admitting: Family Medicine

## 2023-01-06 ENCOUNTER — Other Ambulatory Visit (HOSPITAL_BASED_OUTPATIENT_CLINIC_OR_DEPARTMENT_OTHER): Payer: Self-pay

## 2023-01-06 VITALS — BP 133/115 | HR 104 | Ht 70.0 in | Wt 149.9 lb

## 2023-01-06 DIAGNOSIS — Z7689 Persons encountering health services in other specified circumstances: Secondary | ICD-10-CM

## 2023-01-06 DIAGNOSIS — R030 Elevated blood-pressure reading, without diagnosis of hypertension: Secondary | ICD-10-CM

## 2023-01-06 DIAGNOSIS — K047 Periapical abscess without sinus: Secondary | ICD-10-CM

## 2023-01-06 MED ORDER — CLINDAMYCIN HCL 300 MG PO CAPS
300.0000 mg | ORAL_CAPSULE | Freq: Three times a day (TID) | ORAL | 0 refills | Status: AC
  Filled 2023-01-06: qty 30, 10d supply, fill #0

## 2023-01-06 MED ORDER — DICLOFENAC SODIUM 75 MG PO TBEC
75.0000 mg | DELAYED_RELEASE_TABLET | Freq: Three times a day (TID) | ORAL | 2 refills | Status: DC | PRN
  Filled 2023-01-06: qty 30, 10d supply, fill #0

## 2023-01-06 NOTE — Progress Notes (Signed)
New Patient Office Visit  Subjective    Patient ID: Ryan Richard, male    DOB: 1976/04/12  Age: 47 y.o. MRN: [id number]  CC:  Chief Complaint  Patient presents with   Dental Pain    Abscess, ongoing for about 3 days, left side, swollen, tender, upper jaw   HPI Ryan Richard is a 47 year-old male who presents to establish care. Reports that his jaw his in pain. Appears swollen on the left side. He was at work and just started hurting. Mom reports episode of fever- "over the norm."  Denies N/V. He reports he has a difficult time eating.   Past medical history: DM1 NPH-regular twice daily, open heart surgery- PFA closure in childhood, hernia repair   Mom reports he is taking care of his blind girlfriend and is always working.  He works outside in the heat and does not report eating frequent meals. He reports he is losing weight.   He is applying for insurance and disability at this time.   Outpatient Encounter Medications as of 01/06/2023  Medication Sig   clindamycin (CLEOCIN) 300 MG capsule Take 1 capsule (300 mg total) by mouth 3 (three) times daily for 10 days.   diclofenac (VOLTAREN) 75 MG EC tablet Take 1 tablet (75 mg total) by mouth 3 (three) times daily as needed.   insulin NPH-regular Human (70-30) 100 UNIT/ML injection Inject 25 Units into the skin 2 (two) times daily with a meal.   [DISCONTINUED] ibuprofen (ADVIL) 800 MG tablet Take by mouth.   [DISCONTINUED] naproxen (NAPROSYN) 500 MG tablet Take 1 tablet (500 mg total) by mouth 2 (two) times daily. (Patient not taking: Reported on 01/06/2023)   [DISCONTINUED] ondansetron (ZOFRAN-ODT) 4 MG disintegrating tablet 4mg  ODT q4 hours prn nausea/vomit (Patient not taking: Reported on 01/06/2023)   [DISCONTINUED] trazodone (DESYREL) 300 MG tablet Take by mouth. (Patient not taking: Reported on 01/06/2023)   No facility-administered encounter medications on file as of 01/06/2023.    Past Medical History:  Diagnosis Date   Chronic  back pain    Diabetes mellitus without complication (HCC)     Past Surgical History:  Procedure Laterality Date   HERNIA REPAIR     open heart surgery- valve     OPEN REDUCTION INTERNAL FIXATION (ORIF) METACARPAL Left 06/26/2021   Procedure: OPEN REDUCTION INTERNAL FIXATION OF THIRD AND FOURTH METACARPAL;  Surgeon: Marlyne Beards, MD;  Location: [facility];  Service: Orthopedics;  Laterality: Left;  Pre-operative Block    Family History  Problem Relation Age of Onset   Diabetes Mellitus II Maternal Grandmother    Diabetes Mellitus II Paternal Grandmother      Review of Systems  Constitutional:  Negative for chills, fever, malaise/fatigue and weight loss.  Eyes:  Negative for blurred vision and double vision.  Respiratory:  Negative for cough and shortness of breath.   Cardiovascular:  Negative for chest pain, palpitations and leg swelling.  Gastrointestinal:  Negative for abdominal pain, constipation, diarrhea, nausea and vomiting.  Genitourinary:  Negative for frequency, hematuria and urgency.  Musculoskeletal:  Positive for joint pain (R jaw). Negative for myalgias.  Neurological:  Negative for dizziness, sensory change, speech change, weakness and headaches.  Psychiatric/Behavioral:  Negative for depression and suicidal ideas. The patient is not nervous/anxious and does not have insomnia.     Objective    BP (!) 133/115 Comment: Repeat BP  Pulse (!) 104   Ht 5\' 10"  (1.778 m)   Wt 149 lb  14.4 oz (68 kg)   SpO2 98%   BMI 21.51 kg/m   Physical Exam Constitutional:      Appearance: Normal appearance.  HENT:     Head:     Jaw: Tenderness, swelling and pain on movement present.     Salivary Glands: Right salivary gland is diffusely enlarged and tender. Left salivary gland is not diffusely enlarged or tender.     Mouth/Throat:     Mouth: Mucous membranes are dry.     Dentition: Abnormal dentition. Dental tenderness, dental caries and dental abscesses present.   Cardiovascular:     Rate and Rhythm: Normal rate and regular rhythm.     Pulses: Normal pulses.     Heart sounds: Normal heart sounds.  Pulmonary:     Effort: Pulmonary effort is normal.     Breath sounds: Normal breath sounds.  Lymphadenopathy:     Cervical: Cervical adenopathy present.     Right cervical: Superficial cervical adenopathy present. No deep or posterior cervical adenopathy.    Left cervical: No superficial, deep or posterior cervical adenopathy.  Neurological:     Mental Status: He is alert.  Psychiatric:        Mood and Affect: Mood normal.        Behavior: Behavior normal.        Thought Content: Thought content normal.        Judgment: Judgment normal.      Assessment & Plan:   1. Encounter to establish care Patient is a pleasant 47 year-old male patient who presents today to establish care with primary care provider. Reviewed the past medical history, family history, social history, surgical history, medications, and allergies today- updates made as indicated. Patient has a history of type I diabetes mellitus, PFA closure in childhood, hernia repairs, and polysubstance abuse. He has concerns today for right sided jaw pain.   2. Tooth abscess Patient presents today for right-sided jaw swelling and pain. He reports that this started a few days ago while he was working. Denies any precipitating factors. Denies fever/chills, myalgia, N/V, abdominal pain, difficulty swallowing. Reviewed medication allergies. Will prescribed clindamycin for abscess and diclofenac for pain management. Advised patient to follow-up with dentist- recommended seeking evaluation at [facility].  - clindamycin (CLEOCIN) 300 MG capsule; Take 1 capsule (300 mg total) by mouth 3 (three) times daily for 10 days.  Dispense: 30 capsule; Refill: 0 - diclofenac (VOLTAREN) 75 MG EC tablet; Take 1 tablet (75 mg total) by mouth 3 (three) times daily as needed.  Dispense: 30 tablet; Refill: 2  3. Elevated  blood pressure reading without diagnosis of hypertension Patient presents today with elevated blood pressure, recheck still elevated. Patient in no acute distress and is well-appearing. Denies chest pain, shortness of breath, lower extremity edema, vision changes, headaches. Cardiovascular exam with heart regular rate and rhythm. Normal heart sounds, no murmurs present. No lower extremity edema present. Lungs clear to auscultation bilaterally. Advised patient to closely monitor blood pressure readings at home. Follow-up in 4 weeks.      Return in about 4 weeks (around 02/03/2023) for HTN follow-up, Diabetes f/u.   Alyson Reedy, FNP

## 2023-03-09 ENCOUNTER — Ambulatory Visit (HOSPITAL_BASED_OUTPATIENT_CLINIC_OR_DEPARTMENT_OTHER): Payer: Medicaid Other | Admitting: Family Medicine

## 2023-04-06 ENCOUNTER — Encounter (HOSPITAL_BASED_OUTPATIENT_CLINIC_OR_DEPARTMENT_OTHER): Payer: Self-pay | Admitting: Family Medicine

## 2023-07-23 IMAGING — DX DG HAND COMPLETE 3+V*L*
3 series · 3 of 3 positions shown · non-contrast
Comparison: None.

CLINICAL DATA: Pt stated he was packing his own ammunition at home
and it went off in his left hand. Per RN, visible open wounds to
left hand. Unable to remove bandage for x-rays due to bleeding.

EXAM:
LEFT HAND - COMPLETE 3+ VIEW

[hand ap]
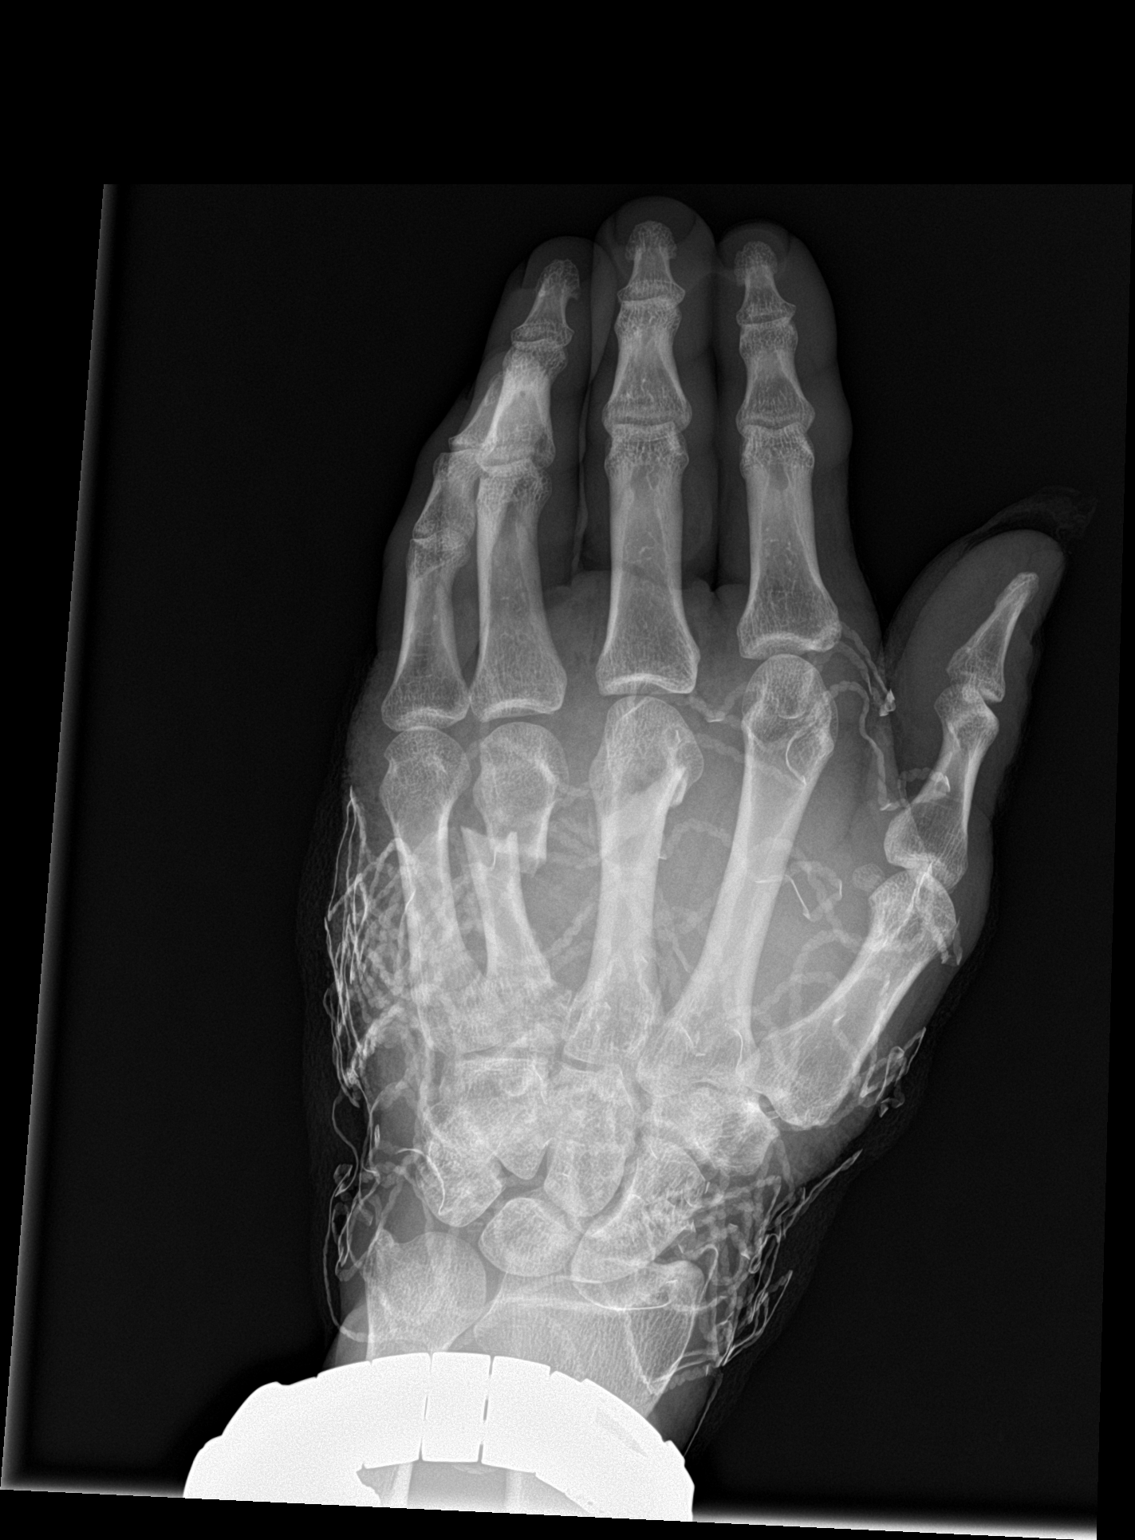

[hand obl]
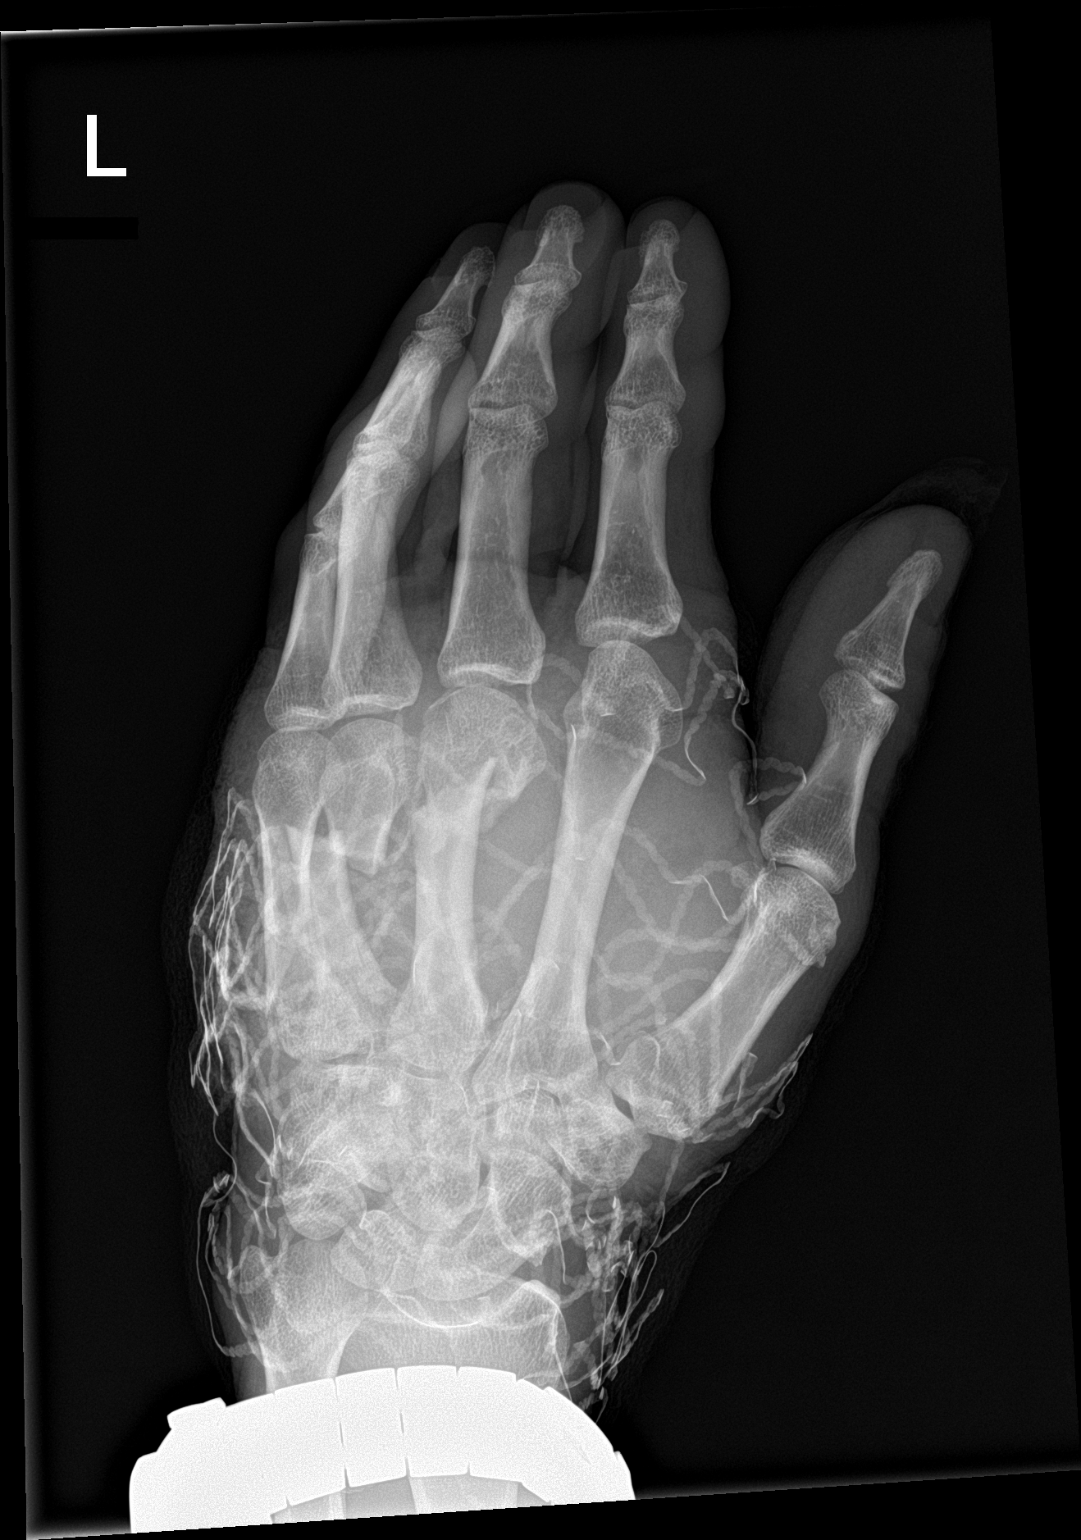

[hand lat]
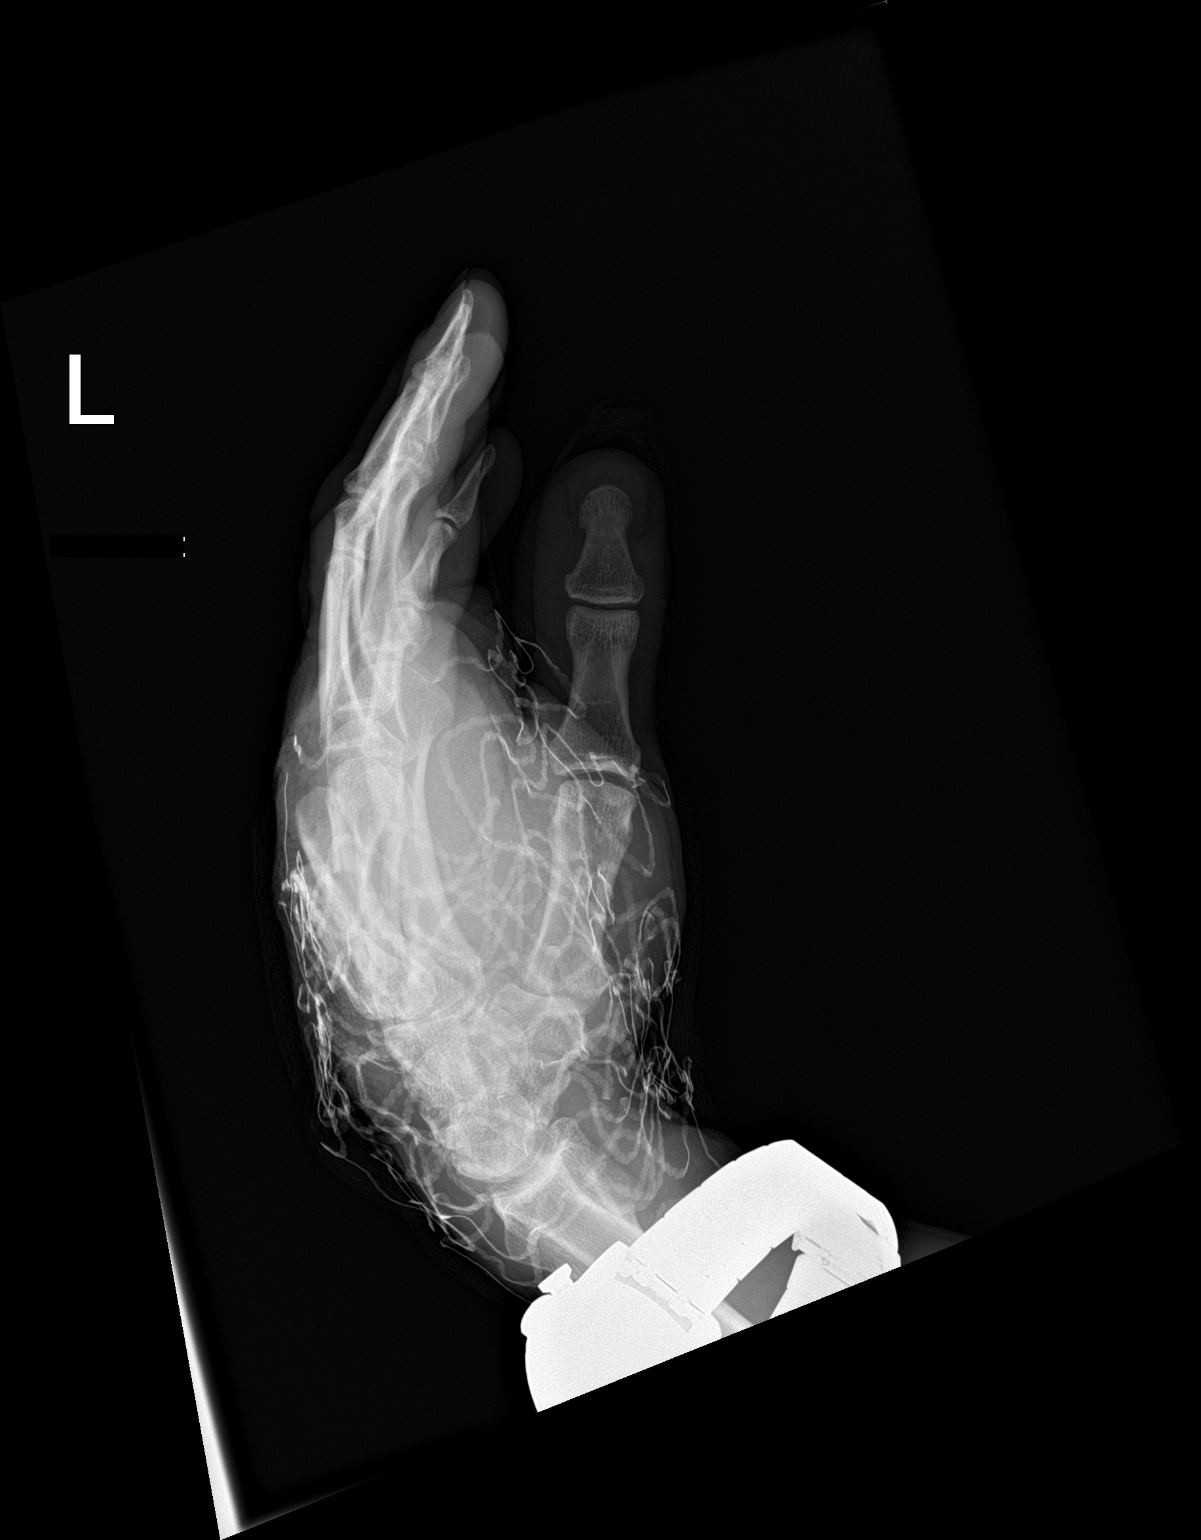

[3 of 3 positions shown; findings below may reference images not displayed]

FINDINGS: Transverse displaced fracture of the distal fourth metacarpal shaft,
displaced anteriorly 1 full shaft width and mildly foreshortened by
approximately 5 mm.

Oblique fracture of the distal shaft of the third metacarpal, mildly
displaced anteriorly by approximately 5 mm.

No other convincing fracture.

Joints are normally spaced and aligned.

There is soft tissue swelling and dorsal soft tissue air. Overlying
dressing contains metallic threads limiting assessment for
radiopaque foreign bodies. No convincing radiopaque foreign body.
IMPRESSION: 1. Displaced fractures of the left third and fourth metacarpals as
described. No dislocation. No convincing radiopaque foreign body
peer

## 2023-09-30 ENCOUNTER — Encounter (HOSPITAL_COMMUNITY): Payer: Self-pay

## 2023-09-30 ENCOUNTER — Emergency Department (HOSPITAL_COMMUNITY)
Admission: EM | Admit: 2023-09-30 | Discharge: 2023-09-30 | Disposition: A | Discharge status: Home / Self Care | Attending: Emergency Medicine | Admitting: Emergency Medicine

## 2023-09-30 ENCOUNTER — Emergency Department (HOSPITAL_COMMUNITY)

## 2023-09-30 ENCOUNTER — Other Ambulatory Visit: Payer: Self-pay

## 2023-09-30 DIAGNOSIS — Z720 Tobacco use: Secondary | ICD-10-CM | POA: Diagnosis not present

## 2023-09-30 DIAGNOSIS — R7401 Elevation of levels of liver transaminase levels: Secondary | ICD-10-CM | POA: Diagnosis not present

## 2023-09-30 DIAGNOSIS — E860 Dehydration: Secondary | ICD-10-CM | POA: Diagnosis not present

## 2023-09-30 DIAGNOSIS — R42 Dizziness and giddiness: Secondary | ICD-10-CM | POA: Diagnosis present

## 2023-09-30 DIAGNOSIS — E1065 Type 1 diabetes mellitus with hyperglycemia: Secondary | ICD-10-CM | POA: Insufficient documentation

## 2023-09-30 DIAGNOSIS — R739 Hyperglycemia, unspecified: Secondary | ICD-10-CM

## 2023-09-30 LAB — CBG MONITORING, ED
Glucose-Capillary: 305 mg/dL — ABNORMAL HIGH (ref 70–99)
Glucose-Capillary: 225 mg/dL — ABNORMAL HIGH (ref 70–99)
Glucose-Capillary: 230 mg/dL — ABNORMAL HIGH (ref 70–99)
Glucose-Capillary: 244 mg/dL — ABNORMAL HIGH (ref 70–99)

## 2023-09-30 LAB — URINALYSIS, ROUTINE W REFLEX MICROSCOPIC
Bacteria, UA: NONE SEEN
Bilirubin Urine: NEGATIVE
Glucose, UA: >=500 mg/dL — AB
Hgb urine dipstick: NEGATIVE
Ketones, ur: NEGATIVE mg/dL
Leukocytes,Ua: NEGATIVE
Nitrite: NEGATIVE
Protein, ur: NEGATIVE mg/dL
Specific Gravity, Urine: 1.031 — ABNORMAL HIGH (ref 1.005–1.030)
pH: 6 (ref 5.0–8.0)

## 2023-09-30 LAB — COMPREHENSIVE METABOLIC PANEL WITH GFR
ALT: 58 U/L — ABNORMAL HIGH (ref 0–44)
AST: 46 U/L — ABNORMAL HIGH (ref 15–41)
Albumin: 3.1 g/dL — ABNORMAL LOW (ref 3.5–5.0)
Alkaline Phosphatase: 99 U/L (ref 38–126)
Anion gap: 8 (ref 5–15)
BUN: 16 mg/dL (ref 6–20)
CO2: 27 mmol/L (ref 22–32)
Calcium: 8.6 mg/dL — ABNORMAL LOW (ref 8.9–10.3)
Chloride: 95 mmol/L — ABNORMAL LOW (ref 98–111)
Creatinine, Ser: 0.82 mg/dL (ref 0.61–1.24)
GFR, Estimated: >60 mL/min (ref >60)
Glucose, Bld: 295 mg/dL — ABNORMAL HIGH (ref 70–99)
Potassium: 3.8 mmol/L (ref 3.5–5.1)
Sodium: 130 mmol/L — ABNORMAL LOW (ref 135–145)
Total Bilirubin: 0.9 mg/dL (ref 0.0–1.2)
Total Protein: 5.7 g/dL — ABNORMAL LOW (ref 6.5–8.1)

## 2023-09-30 LAB — BLOOD GAS, VENOUS
Acid-Base Excess: 5.8 mmol/L — ABNORMAL HIGH (ref 0.0–2.0)
Bicarbonate: 31.2 mmol/L — ABNORMAL HIGH (ref 20.0–28.0)
O2 Saturation: 83.1 %
Patient temperature: 37
pCO2, Ven: 47 mmHg (ref 44–60)
pH, Ven: 7.43 (ref 7.25–7.43)
pO2, Ven: 42 mmHg (ref 32–45)

## 2023-09-30 LAB — I-STAT CHEM 8, ED
BUN: 19 mg/dL (ref 6–20)
Calcium, Ion: 1.09 mmol/L — ABNORMAL LOW (ref 1.15–1.40)
Chloride: 95 mmol/L — ABNORMAL LOW (ref 98–111)
Creatinine, Ser: 0.8 mg/dL (ref 0.61–1.24)
Glucose, Bld: 372 mg/dL — ABNORMAL HIGH (ref 70–99)
HCT: 44 % (ref 39.0–52.0)
Hemoglobin: 15 g/dL (ref 13.0–17.0)
Potassium: 5.2 mmol/L — ABNORMAL HIGH (ref 3.5–5.1)
Sodium: 131 mmol/L — ABNORMAL LOW (ref 135–145)
TCO2: 28 mmol/L (ref 22–32)

## 2023-09-30 LAB — CBC
HCT: 43.8 % (ref 39.0–52.0)
Hemoglobin: 14.9 g/dL (ref 13.0–17.0)
MCH: 28.9 pg (ref 26.0–34.0)
MCHC: 34 g/dL (ref 30.0–36.0)
MCV: 84.9 fL (ref 80.0–100.0)
Platelets: 304 10*3/uL (ref 150–400)
RBC: 5.16 MIL/uL (ref 4.22–5.81)
RDW: 12.3 % (ref 11.5–15.5)
WBC: 8.6 10*3/uL (ref 4.0–10.5)
nRBC: 0 % (ref 0.0–0.2)

## 2023-09-30 LAB — BETA-HYDROXYBUTYRIC ACID: Beta-Hydroxybutyric Acid: 0.14 mmol/L (ref 0.05–0.27)

## 2023-09-30 MED ORDER — MORPHINE SULFATE (PF) 4 MG/ML IV SOLN
4.0000 mg | Freq: Once | INTRAVENOUS | Status: AC
  Administered 2023-09-30: 4 mg via INTRAVENOUS
  Filled 2023-09-30: qty 1

## 2023-09-30 MED ORDER — SODIUM CHLORIDE 0.9 % IV BOLUS
1000.0000 mL | Freq: Once | INTRAVENOUS | Status: AC
  Administered 2023-09-30: 1000 mL via INTRAVENOUS

## 2023-09-30 MED ORDER — INSULIN ASPART 100 UNIT/ML IJ SOLN
10.0000 [IU] | Freq: Three times a day (TID) | INTRAMUSCULAR | 2 refills | Status: AC
Start: 2023-09-30 — End: ?

## 2023-09-30 MED ORDER — INSULIN GLARGINE-YFGN 100 UNIT/ML ~~LOC~~ SOLN
20.0000 [IU] | Freq: Once | SUBCUTANEOUS | Status: AC
  Administered 2023-09-30: 20 [IU] via SUBCUTANEOUS
  Filled 2023-09-30: qty 0.2

## 2023-09-30 MED ORDER — INSULIN GLARGINE 100 UNIT/ML ~~LOC~~ SOLN: 40.0000 [IU] | Freq: Every day | SUBCUTANEOUS | 2 refills | Status: DC

## 2023-09-30 MED ORDER — KETOROLAC TROMETHAMINE 15 MG/ML IJ SOLN
15.0000 mg | Freq: Once | INTRAMUSCULAR | Status: AC
  Administered 2023-09-30: 15 mg via INTRAVENOUS
  Filled 2023-09-30: qty 1

## 2023-09-30 MED ORDER — ONDANSETRON HCL 4 MG/2ML IJ SOLN
4.0000 mg | Freq: Once | INTRAMUSCULAR | Status: AC
  Administered 2023-09-30: 4 mg via INTRAVENOUS
  Filled 2023-09-30: qty 2

## 2023-09-30 NOTE — ED Provider Notes (Signed)
 [place] EMERGENCY DEPARTMENT AT [facility] Provider Note   CSN: [id number] Arrival date & time: 09/30/23  1719     History  Chief Complaint  Patient presents with   Hyperglycemia   Shortness of Breath   Dizziness    Ryan Richard is a 48 y.o. male with past medical history significant for type 1 diabetes, bipolar disorder, depression, DKA, tobacco use, polysubstance abuse presents with concern for greater than 500 CBG at home, shortness of breath, dizziness, increased thirst and urination for 1 week.  Patient reports he saw a new PCP who reported that they did not think he had type 1 diabetes, took him off of his home insulin  and switch him to metformin around 4 days ago.  Has been feeling sick since then.  He reports that he had some insulin  at home and he took 20 units of short acting just prior to arrival.  No change in diet.  Feeling nauseous at time of initial evaluation.   Hyperglycemia Associated symptoms: dizziness and shortness of breath   Shortness of Breath Dizziness Associated symptoms: shortness of breath        Home Medications Prior to Admission medications   Medication Sig Start Date End Date Taking? Authorizing Provider  insulin  aspart (NOVOLOG ) 100 UNIT/ML injection Inject 10 Units into the skin 3 (three) times daily before meals. Sliding scale 09/30/23  Yes Dartanyon Frankowski H, PA-C  insulin  glargine (LANTUS) 100 UNIT/ML injection Inject 0.4 mLs (40 Units total) into the skin daily. 09/30/23  Yes Argelio Granier H, PA-C  diclofenac  (VOLTAREN ) 75 MG EC tablet Take 1 tablet (75 mg total) by mouth 3 (three) times daily as needed. 01/06/23   Wilhelmena Hanson, FNP  insulin  NPH-regular Human (70-30) 100 UNIT/ML injection Inject 25 Units into the skin 2 (two) times daily with a meal.    [provider]      Allergies    Penicillins    Review of Systems   Review of Systems  Respiratory:  Positive for shortness of breath.    Neurological:  Positive for dizziness.  All other systems reviewed and are negative.   Physical Exam Updated Vital Signs BP (!) 143/98   Pulse 90   Temp 97.8 F (36.6 C) (Oral)   Resp 16   Ht 5\' 10"  (1.778 m)   Wt 68 kg   SpO2 100%   BMI 21.52 kg/m  Physical Exam Vitals and nursing note reviewed.  Constitutional:      General: He is not in acute distress.    Appearance: Normal appearance. He is ill-appearing.  HENT:     Head: Normocephalic and atraumatic.  Eyes:     General:        Right eye: No discharge.        Left eye: No discharge.  Cardiovascular:     Rate and Rhythm: Regular rhythm. Tachycardia present.     Heart sounds: No murmur heard.    No friction rub. No gallop.  Pulmonary:     Effort: Pulmonary effort is normal.     Breath sounds: Normal breath sounds.  Abdominal:     General: Bowel sounds are normal.     Palpations: Abdomen is soft.     Comments: No significant tenderness to palpation throughout the abdomen.  Skin:    General: Skin is warm and dry.     Capillary Refill: Capillary refill takes less than 2 seconds.  Neurological:     Mental Status: He is  alert and oriented to person, place, and time.     Comments: Cranial nerves II through XII grossly intact.  Intact finger-nose, intact heel-to-shin.  Romberg negative, gait normal.  Alert and oriented x3.  Moves all 4 limbs spontaneously, normal coordination.  No pronator drift.  Intact strength 5 out of 5 bilateral upper and lower extremities.  Psychiatric:        Mood and Affect: Mood normal.        Behavior: Behavior normal.     ED Results / Procedures / Treatments   Labs (all labs ordered are listed, but only abnormal results are displayed) Labs Reviewed  URINALYSIS, ROUTINE W REFLEX MICROSCOPIC - Abnormal; Notable for the following components:      Result Value   Color, Urine STRAW (*)    Specific Gravity, Urine 1.031 (*)    Glucose, UA >=500 (*)    All other components within normal  limits  BLOOD GAS, VENOUS - Abnormal; Notable for the following components:   Bicarbonate 31.2 (*)    Acid-Base Excess 5.8 (*)    All other components within normal limits  COMPREHENSIVE METABOLIC PANEL WITH GFR - Abnormal; Notable for the following components:   Sodium 130 (*)    Chloride 95 (*)    Glucose, Bld 295 (*)    Calcium 8.6 (*)    Total Protein 5.7 (*)    Albumin 3.1 (*)    AST 46 (*)    ALT 58 (*)    All other components within normal limits  CBG MONITORING, ED - Abnormal; Notable for the following components:   Glucose-Capillary 305 (*)    All other components within normal limits  I-STAT CHEM 8, ED - Abnormal; Notable for the following components:   Sodium 131 (*)    Potassium 5.2 (*)    Chloride 95 (*)    Glucose, Bld 372 (*)    Calcium, Ion 1.09 (*)    All other components within normal limits  CBG MONITORING, ED - Abnormal; Notable for the following components:   Glucose-Capillary 225 (*)    All other components within normal limits  CBG MONITORING, ED - Abnormal; Notable for the following components:   Glucose-Capillary 230 (*)    All other components within normal limits  CBC  BETA-HYDROXYBUTYRIC ACID  CBG MONITORING, ED  CBG MONITORING, ED    EKG EKG Interpretation Date/Time:  Saturday Sep 30 2023 17:32:11 EDT Ventricular Rate:  113 PR Interval:  133 QRS Duration:  101 QT Interval:  334 QTC Calculation: 458 R Axis:   85  Text Interpretation: Sinus tachycardia RAE, consider biatrial enlargement ST elevations c/w early repolarization similar to 2023 Confirmed by Jerilynn Montenegro [phone]) on 09/30/2023 7:57:21 PM  Radiology CT Head Wo Contrast Result Date: 09/30/2023 CLINICAL DATA:  Neuro deficit, acute, stroke suspected EXAM: CT HEAD WITHOUT CONTRAST TECHNIQUE: Contiguous axial images were obtained from the base of the skull through the vertex without intravenous contrast. RADIATION DOSE REDUCTION: This exam was performed according to the departmental  dose-optimization program which includes automated exposure control, adjustment of the mA and/or kV according to patient size and/or use of iterative reconstruction technique. COMPARISON:  None Available. FINDINGS: Brain: No evidence of acute infarction, hemorrhage, hydrocephalus, extra-axial collection or mass lesion/mass effect. Vascular: No hyperdense vessel. Skull: No acute fracture. Sinuses/Orbits: Clear sinuses.  No acute orbital findings. Other: No mastoid effusions. IMPRESSION: No evidence of acute intracranial abnormality. Electronically Signed   By: Stevenson Elbe M.D.   On:  09/30/2023 19:45   DG Chest Portable 1 View Result Date: 09/30/2023 CLINICAL DATA:  Shortness of breath. EXAM: PORTABLE CHEST 1 VIEW COMPARISON:  May 02, 2022 FINDINGS: The heart size and mediastinal contours are within normal limits. Radiopaque surgical clip is seen overlying the aortic arch. This is present on the prior study. Both lungs are clear. The visualized skeletal structures are unremarkable. IMPRESSION: No active disease. Electronically Signed   By: Virgle Grime M.D.   On: 09/30/2023 18:29    Procedures Procedures    Medications Ordered in ED Medications  insulin  glargine-yfgn (SEMGLEE) injection 20 Units (has no administration in time range)  ondansetron  (ZOFRAN ) injection 4 mg (4 mg Intravenous Given 09/30/23 1806)  sodium chloride  0.9 % bolus 1,000 mL (0 mLs Intravenous Stopped 09/30/23 1904)  ketorolac (TORADOL) 15 MG/ML injection 15 mg (15 mg Intravenous Given 09/30/23 1947)  morphine  (PF) 4 MG/ML injection 4 mg (4 mg Intravenous Given 09/30/23 1948)  sodium chloride  0.9 % bolus 1,000 mL (0 mLs Intravenous Stopped 09/30/23 2118)    ED Course/ Medical Decision Making/ A&P                                 Medical Decision Making Amount and/or Complexity of Data Reviewed Labs: ordered. Radiology: ordered.  Risk Prescription drug management.   This patient is a 48 y.o. male  who  presents to the ED for concern of sob, dizziness, increased thirst, hyperglycemia.   Differential diagnoses prior to evaluation: The emergent differential diagnosis includes, but is not limited to,  BPPV, vestibular migraine, head trauma, AVM, intracranial tumor, multiple sclerosis, drug-related, CVA, orthostatic hypotension, sepsis, hypoglycemia, electrolyte disturbance, anemia, anxiety -- HHS, DKA, vs simple hyperglycemia . This is not an exhaustive differential.   Past Medical History / Co-morbidities / Social History: type 1 diabetes, bipolar disorder, depression, DKA, tobacco use, polysubstance abuse  Additional history: Chart reviewed. Pertinent results include: reviewed labwork, imaging   Physical Exam: Physical exam performed. The pertinent findings include: initially tachycardic, otherwise, vital signs stable. Mild diffuse abdominal tenderness to palpation.  Lab Tests/Imaging studies: I personally interpreted labs/imaging and the pertinent results include:  CBC unremarkable. UA with high specific gravity, no ketones. VBG with no acidosis. POC CBG 305 on arrival, 225 on recheck. BHB 0.14, CMP shows pseudohyponatremia, sodium 130 in context of glucose 295, he is mildly elevated AST, ALT at 46, 58, otherwise unremarkable, mild chloride deficit, 95.  I independently interpreted plain film chest xray which shows no acute intrathoracic abnormality. I agree with the radiologist interpretation.  Cardiac monitoring: EKG obtained and interpreted by myself and attending physician which shows: sinus tachycardia, diffuse ST elevations   Medications: I ordered medication including bolus, zofran  for dehydration, nausea.  I have reviewed the patients home medicines and have made adjustments as needed.   Disposition: After consideration of the diagnostic results and the patients response to treatment, I feel that patient stable for discharge -- refilled his home insulin  .   Feeling improved after  fluids, no evidence of acute intracranial abnormality on CT independently interpreted by myself, I agree with radiology interpretation.  emergency department workup does not suggest an emergent condition requiring admission or immediate intervention beyond what has been performed at this time. The plan is: as above. The patient is safe for discharge and has been instructed to return immediately for worsening symptoms, change in symptoms or any other concerns.  Final Clinical Impression(s) /  ED Diagnoses Final diagnoses:  Dehydration  Hyperglycemia  Dizziness    Rx / DC Orders ED Discharge Orders          Ordered    insulin  glargine (LANTUS) 100 UNIT/ML injection  Daily        09/30/23 2112    insulin  aspart (NOVOLOG ) 100 UNIT/ML injection  3 times daily before meals        09/30/23 2112              Aitan Rossbach, Ferdie Housekeeper 09/30/23 2120    Jerilynn Montenegro, MD 09/30/23 2157

## 2023-09-30 NOTE — ED Notes (Signed)
 Patient transported to CT

## 2023-09-30 NOTE — Discharge Instructions (Addendum)
 Please take your insulin  as prescribed and return if you have worsening blood sugar despite taking your medication as prescribed.

## 2023-09-30 NOTE — ED Triage Notes (Signed)
 Pt c/o cbg of over 500 at home, and SOB and dizziness for 1x week. Pt was taken off insulin  and switched to Metformin 4x days ago.  Reports increased thirst and urination

## 2023-11-03 ENCOUNTER — Other Ambulatory Visit (HOSPITAL_BASED_OUTPATIENT_CLINIC_OR_DEPARTMENT_OTHER): Payer: Self-pay

## 2023-11-03 ENCOUNTER — Emergency Department (HOSPITAL_BASED_OUTPATIENT_CLINIC_OR_DEPARTMENT_OTHER): Admitting: Radiology

## 2023-11-03 ENCOUNTER — Emergency Department (HOSPITAL_BASED_OUTPATIENT_CLINIC_OR_DEPARTMENT_OTHER)
Admission: EM | Admit: 2023-11-03 | Discharge: 2023-11-03 | Disposition: A | Discharge status: Home / Self Care | Attending: Emergency Medicine | Admitting: Emergency Medicine

## 2023-11-03 ENCOUNTER — Other Ambulatory Visit: Payer: Self-pay

## 2023-11-03 DIAGNOSIS — S61252A Open bite of right middle finger without damage to nail, initial encounter: Secondary | ICD-10-CM | POA: Insufficient documentation

## 2023-11-03 DIAGNOSIS — Z794 Long term (current) use of insulin: Secondary | ICD-10-CM | POA: Insufficient documentation

## 2023-11-03 DIAGNOSIS — W540XXA Bitten by dog, initial encounter: Secondary | ICD-10-CM | POA: Diagnosis not present

## 2023-11-03 DIAGNOSIS — S61451A Open bite of right hand, initial encounter: Secondary | ICD-10-CM | POA: Diagnosis present

## 2023-11-03 MED ORDER — CLINDAMYCIN HCL 150 MG PO CAPS
300.0000 mg | ORAL_CAPSULE | Freq: Once | ORAL | Status: AC
  Administered 2023-11-03: 300 mg via ORAL
  Filled 2023-11-03: qty 2

## 2023-11-03 MED ORDER — CLINDAMYCIN HCL 150 MG PO CAPS
300.0000 mg | ORAL_CAPSULE | Freq: Three times a day (TID) | ORAL | 0 refills | Status: AC
  Filled 2023-11-03 – 2023-11-15 (×2): qty 42, 7d supply, fill #0

## 2023-11-03 MED ORDER — CELECOXIB 200 MG PO CAPS
200.0000 mg | ORAL_CAPSULE | Freq: Two times a day (BID) | ORAL | 0 refills | Status: DC | PRN
  Filled 2023-11-03 – 2023-11-15 (×2): qty 30, 15d supply, fill #0

## 2023-11-03 MED ORDER — LIDOCAINE HCL (PF) 1 % IJ SOLN
5.0000 mL | Freq: Once | INTRAMUSCULAR | Status: AC
  Administered 2023-11-03: 5 mL
  Filled 2023-11-03: qty 5

## 2023-11-03 MED ORDER — CLINDAMYCIN HCL 300 MG PO CAPS
300.0000 mg | ORAL_CAPSULE | Freq: Three times a day (TID) | ORAL | 0 refills | Status: DC
  Filled 2023-11-03: qty 21, 7d supply, fill #0

## 2023-11-03 MED ORDER — BACITRACIN ZINC 500 UNIT/GM EX OINT
1.0000 | TOPICAL_OINTMENT | Freq: Two times a day (BID) | CUTANEOUS | 0 refills | Status: DC
  Filled 2023-11-03: qty 28, 14d supply, fill #0

## 2023-11-03 MED ORDER — HYDROCODONE-ACETAMINOPHEN 5-325 MG PO TABS
1.0000 | ORAL_TABLET | Freq: Once | ORAL | Status: AC
  Administered 2023-11-03: 1 via ORAL
  Filled 2023-11-03: qty 1

## 2023-11-03 NOTE — ED Provider Notes (Signed)
 [place] EMERGENCY DEPARTMENT AT [facility] Provider Note   CSN: [id number] Arrival date & time: 11/03/23  1227     Patient presents with: Animal Bite   Ryan Richard is a 48 y.o. male.    Animal Bite  48 year old male presents emergency department with complaints of dog bite to right hand.  Was found to break up a fight between his dog and a friend's dog.  States that one of the dogs bit his right hand.  States he is having burning over the sites of cuts but no pain with moving his hand.  Denies any weakness or sensory deficits.  Denies pain/trauma elsewhere.  States that the dogs were updated on their vaccines.  States that he is updated on his Tdap.  Presents given concern for possibly needed stitches.  Past medical history significant for diabetes mellitus, chronic back pain.   Prior to Admission medications   Medication Sig Start Date End Date Taking? Authorizing Provider  diclofenac  (VOLTAREN ) 75 MG EC tablet Take 1 tablet (75 mg total) by mouth 3 (three) times daily as needed. 01/06/23   Towana Small, FNP  insulin  aspart (NOVOLOG ) 100 UNIT/ML injection Inject 10 Units into the skin 3 (three) times daily before meals. Sliding scale 09/30/23   Prosperi, Christian H, PA-C  insulin  glargine (LANTUS ) 100 UNIT/ML injection Inject 0.4 mLs (40 Units total) into the skin daily. 09/30/23   Prosperi, Christian H, PA-C  insulin  NPH-regular Human (70-30) 100 UNIT/ML injection Inject 25 Units into the skin 2 (two) times daily with a meal.    [provider]    Allergies: Penicillins    Review of Systems  All other systems reviewed and are negative.   Updated Vital Signs BP (!) 159/119   Pulse (!) 111   Temp 98 F (36.7 C)   Resp 16   SpO2 99%   Physical Exam Vitals and nursing note reviewed.  Constitutional:      General: He is not in acute distress.    Appearance: He is well-developed.  HENT:     Head: Normocephalic and atraumatic.   Eyes:      Conjunctiva/sclera: Conjunctivae normal.    Cardiovascular:     Rate and Rhythm: Normal rate and regular rhythm.     Heart sounds: No murmur heard. Pulmonary:     Effort: Pulmonary effort is normal. No respiratory distress.     Breath sounds: Normal breath sounds.  Abdominal:     Palpations: Abdomen is soft.     Tenderness: There is no abdominal tenderness.   Musculoskeletal:        General: No swelling.     Cervical back: Neck supple.     Comments: Full range of motion digits of right hand as well as right wrist.  Patient with 2.7 cm linear laceration very superficial on the dorsal aspect of index finger spanning over PIP.  No obvious bony tenderness of affected digit.  Linear 2.2 cm laceration appreciated on the medial aspect of middle finger just proximal to PIP.  No obvious ligamentous/tendinous involvement.  Superficial abrasion appreciated dorsal aspect of right hand x 2 1 measuring 1.0 and the other measuring 2.7 cm.   Skin:    General: Skin is warm and dry.     Capillary Refill: Capillary refill takes less than 2 seconds.   Neurological:     Mental Status: He is alert.   Psychiatric:        Mood and Affect: Mood normal.     (  all labs ordered are listed, but only abnormal results are displayed) Labs Reviewed - No data to display  EKG: None  Radiology: No results found.   .Laceration Repair  Date/Time: 11/03/2023 1:45 PM  Performed by: Silver Wonda LABOR, PA Authorized by: Silver Wonda LABOR, PA   Consent:    Consent obtained:  Verbal   Consent given by:  Patient   Risks, benefits, and alternatives were discussed: yes     Risks discussed:  Infection, need for additional repair, nerve damage, poor wound healing, pain and poor cosmetic result   Alternatives discussed:  No treatment and delayed treatment Universal protocol:    Procedure explained and questions answered to patient or proxy's satisfaction: yes     Patient identity confirmed:  Verbally with  patient Anesthesia:    Anesthesia method:  Local infiltration   Local anesthetic:  Lidocaine  1% w/o epi Laceration details:    Location:  Finger   Finger location:  R long finger   Length (cm):  2.2 Pre-procedure details:    Preparation:  Imaging obtained to evaluate for foreign bodies Exploration:    Limited defect created (wound extended): no     Hemostasis achieved with:  Direct pressure   Imaging obtained: x-ray     Imaging outcome: foreign body not noted     Wound exploration: wound explored through full range of motion and entire depth of wound visualized     Wound extent: no nerve damage     Contaminated: no   Treatment:    Area cleansed with:  Saline and povidone-iodine   Amount of cleaning:  Standard   Irrigation solution:  Sterile water   Irrigation volume:  500cc   Irrigation method:  Pressure wash   Visualized foreign bodies/material removed: no     Debridement:  None   Undermining:  None   Scar revision: no   Skin repair:    Repair method:  Sutures   Suture size:  4-0   Wound skin closure material used: Vicryl.   Suture technique:  Simple interrupted   Number of sutures:  1 Approximation:    Approximation:  Loose Repair type:    Repair type:  Simple Post-procedure details:    Dressing:  Splint for protection and non-adherent dressing   Procedure completion:  Tolerated well, no immediate complications    Medications Ordered in the ED - No data to display                                  Medical Decision Making Amount and/or Complexity of Data Reviewed Radiology: ordered.  Risk OTC drugs. Prescription drug management.   This patient presents to the ED for concern of animal bite, this involves an extensive number of treatment options, and is a complaint that carries with it a high risk of complications and morbidity.  The differential diagnosis includes fracture, strain/pain, dislocation, ligament/tendon injury, neurovascular compromise, foreign body  retainment, other     Co morbidities that complicate the patient evaluation   See HPI     Additional history obtained:   Additional history obtained from EMR External records from outside source obtained and reviewed including hospital records     Lab Tests:   N/a     Imaging Studies ordered:   I independently ordered right hand x-ray and intimately interpreted was agreed with radiologist's interpretation of no acute osseous abnormality.  Small radiopaque object 3 mm or  so in between 2nd and 3rd distal metacarpals.     Cardiac Monitoring: / EKG:   N/a     Consultations Obtained:   N/a     Problem List / ED Course / Critical interventions / Medication management   Animal bite  I ordered medication including lidocaine , clindamycin  Reevaluation of the patient after these medicines showed that the patient improved I have reviewed the patients home medicines and have made adjustments as needed     Social Determinants of Health:   Denies tobacco, licit drug use     Test / Admission - Considered:   Animal bite  vitals signs within normal range and stable throughout visit. 48 year old male presents emergency department with complaints of dog bite to right hand.  Was found to break up a fight between his dog and a friend's dog.  States that one of the dogs bit his right hand.  States he is having burning over the sites of cuts but no pain with moving his hand.  Denies any weakness or sensory deficits.  Denies pain/trauma elsewhere.  States that the dogs were updated on their vaccines.  States that he is updated on his Tdap.  Presents given concern for possibly needed stitches. On exam, full range of motion digits of right hand.  2 lacerations on patient's right hand 1 involving index finger 1 involving middle finger.  Index finger very superficial in nature and was not closed in the emergency department as it is well-approximated.  Index finger, 1 suture was placed to close  area loosely.  No obvious tendinous involvement clinically.  X-ray obtained which did not show acute osseous abnormality but did show evidence of foreign body present in patient superficial skin avulsion that was removed during irrigation.  Will place patient on clindamycin  for empiric antibiotic coverage given that he is allergic to penicillins.  Patient placed in finger splints given area where lacerations present.  Will recommend local wound care at home and follow-up for suture removal.  Treatment plan discussed with patient and he acknowledged understanding was agreeable to said plan.  Patient will well-appearing, afebrile in no acute distress. Worrisome signs and symptoms were discussed with the patient, and the patient acknowledged understanding to return to the ED if noticed. Patient was stable upon discharge.      Final diagnoses:  None    ED Discharge Orders     None          Silver Wonda LABOR, [place] 11/03/23 1615    Cottie Donnice PARAS, MD 11/04/23 [phone]

## 2023-11-03 NOTE — ED Triage Notes (Signed)
 Patient states dog bite on right hand. Dogs vaccinations up to date.

## 2023-11-03 NOTE — ED Notes (Addendum)
 Laceration and wounds on right hand cleaned with sterile water. Dressed with non-stick pad and Kerlix. Pt tolerated well.

## 2023-11-03 NOTE — ED Notes (Signed)
Reviewed discharge instructions, medications, and home care with pt. Pt verbalized understanding and had no further questions. Pt exited ED without complications.

## 2023-11-03 NOTE — ED Notes (Signed)
 X-Ray at bedside.

## 2023-11-03 NOTE — Discharge Instructions (Signed)
 As discussed, the 1 larger laceration was repaired using 1 absorbable stitch.  The stitch should fall out on its own.  Will place on antibiotics given dirty nature of wound.  Will send an anti-inflammatory to take for pain as well as antibiotic ointment to place over remaining areas while they heal.  Recommend follow-up with your primary care for reassessment of the wounds next week.  Splint for protection.  Please not hesitate to return if the worrisome signs and symptoms we discussed become apparent.

## 2023-11-13 ENCOUNTER — Other Ambulatory Visit (HOSPITAL_BASED_OUTPATIENT_CLINIC_OR_DEPARTMENT_OTHER): Payer: Self-pay

## 2023-11-15 ENCOUNTER — Other Ambulatory Visit (HOSPITAL_BASED_OUTPATIENT_CLINIC_OR_DEPARTMENT_OTHER): Payer: Self-pay

## 2024-02-01 NOTE — Progress Notes (Signed)
 [facility] NEW PATIENT VISIT   Chief Complaint  Patient presents with  . Diabetes    Diabetes management     Diagnoses and all orders for this visit:  Type 1 diabetes mellitus with hyperglycemia    (CMD) -     POC Glucose -     POC Urinalysis without Microscopic Request -     Dexcom G7 Sensor; Replace every 10 days. -     insulin  glargine (Lantus  Solostar U-100 Insulin ) 100 unit/mL (3 mL) pen; Inject 35 once daily. -     pen needle, diabetic 32 gauge x 5/32 ndle; Use for insulin  5 times daily -     insulin  lispro (HumaLOG KwikPen) 100 unit/mL KwikPen; Inject up to 4 times daily based on sliding scale of 1:30>150. Max TDD -     Glutamic Acid Decarboxylase (GAD65) Antibody; Future -     C-peptide; Future -     Glucose, Random; Future -     Basic Metabolic Panel; Future -     Albumin, Random Urine -     TSH; Future -     Lipid Panel; Future -     acetone, urine, test (Ketone Urine Test) strp; Use as needed for BG >300 up to twice daily. -     glucagon (Baqsimi) 3 mg/actuation nasal spray; Use 1 spray in 1 nostril once as directed for severe low blood sugar (hypoglycemia). -     POC Hemoglobin A1c -     POC Urinalysis without Microscopic (Manual Read) Blood sugar severely elevated in office but urine ketones are negative and patient denies symptoms.  Advised patient of symptoms of DKA, prescribed urine ketone strips, and advised on indications to seek emergency medical care.  Continue Lantus  35 units once daily as patient reported episode of hypoglycemia upon waking last week. Provided new correction scale for Humalog based on patient's total daily dose reported to be roughly 55 units.  Correction scale now 1: 30> 150 Patient downloaded and created account for Dexcom G7 in office sample sensor applied.  Patient synced with clinic. Referral placed to diabetes education for prepump assessment.  Advised that pump still require patient input and engagement in order  to effectively control blood sugar but that the pump may be able to reduce patient's hyperglycemia significantly and reduce risk for DKA.  Labs ordered for pump orders in anticipation of the pump start. Patient reports having already been referred to an eye specialist for diabetic screening eye exam and is waiting to be contacted to schedule.    Return in about 1 month (around 03/02/2024) for T1DM, Dexcom, Phone.   HPI:  This is a 48 y.o. male who presents for evaluation.  The following issues were addressed in clinic today: Patient with history of type 1 diabetes since childhood presents with his mother.  Blood glucose in office elevated to 552.  Urine negative for ketones in office.  Patient denies abdominal pain nausea and vomiting.  Patient appears to feel unwell but he attributes this to an ear infection for which he just started antibiotics yesterday.  Reports currently injecting 35 units of Lantus  daily and using quick acting on insulin  before meals by sliding scale but unable to recall the sliding scale reports on average total in a day using roughly 20 units of quick acting insulin  in addition to his Lantus .  Patient reports not having emergency glucagon at home.  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  Mom is concerned because patient has been taking good care of himself.  Reports plan to get established with a new primary care provider.  Mom reports that patient is getting ready to move into her so that she can help him better take care of himself.  Mom expressed interest in patient getting on a pump in order to alleviate patient having to do so much of it himself because he is very forgetful.   Objective: Body mass index is 20.46 kg/m.  BP 99/81 (BP Location: Left arm, Patient Position: Sitting)   Pulse 108   Temp 98 F (36.7 C) (Temporal)   Ht  1.778 m (5' 10)   Wt 64.7 kg (142 lb 9.6 oz)   SpO2 97%   BMI 20.46 kg/m   Physical Exam Vitals reviewed.  Constitutional:      General: He is not in acute distress.    Appearance: Normal appearance. He is not toxic-appearing.  Cardiovascular:     Rate and Rhythm: Normal rate.  Pulmonary:     Effort: Pulmonary effort is normal.  Neurological:     Mental Status: He is alert. Mental status is at baseline.  Psychiatric:        Mood and Affect: Mood normal.        Behavior: Behavior normal.      Pertinent Labs: Component     Latest Ref Rng 02/01/2024  Glucose, POC     70 - 99 mg/dL 447 (HH)     Component     Latest Ref Rng 02/01/2024  Ketones, Urine     Negative mg/dL Negative     Component     Latest Ref Rng 02/01/2024  Hemoglobin A1c (POC)     4.2 - 5.6 % 12.2 !     Legend: ! Abnormal Legend: [facility]) High Critical    Electronically signed by: Dorothyann Montgomery Schlossman, PA-C 02/01/2024 6:12 PM

## 2024-02-03 ENCOUNTER — Inpatient Hospital Stay (HOSPITAL_COMMUNITY)
Admission: EM | Admit: 2024-02-03 | Discharge: 2024-02-05 | DRG: 871 | Discharge status: Against Medical Advice | Attending: Internal Medicine | Admitting: Internal Medicine

## 2024-02-03 ENCOUNTER — Emergency Department (HOSPITAL_COMMUNITY)

## 2024-02-03 DIAGNOSIS — E43 Unspecified severe protein-calorie malnutrition: Secondary | ICD-10-CM | POA: Diagnosis present

## 2024-02-03 DIAGNOSIS — F1911 Other psychoactive substance abuse, in remission: Secondary | ICD-10-CM | POA: Diagnosis present

## 2024-02-03 DIAGNOSIS — R6520 Severe sepsis without septic shock: Secondary | ICD-10-CM | POA: Diagnosis present

## 2024-02-03 DIAGNOSIS — Z87891 Personal history of nicotine dependence: Secondary | ICD-10-CM | POA: Diagnosis not present

## 2024-02-03 DIAGNOSIS — E1010 Type 1 diabetes mellitus with ketoacidosis without coma: Secondary | ICD-10-CM | POA: Diagnosis present

## 2024-02-03 DIAGNOSIS — G8929 Other chronic pain: Secondary | ICD-10-CM | POA: Diagnosis present

## 2024-02-03 DIAGNOSIS — Z833 Family history of diabetes mellitus: Secondary | ICD-10-CM

## 2024-02-03 DIAGNOSIS — A419 Sepsis, unspecified organism: Principal | ICD-10-CM | POA: Diagnosis present

## 2024-02-03 DIAGNOSIS — H7092 Unspecified mastoiditis, left ear: Secondary | ICD-10-CM | POA: Diagnosis present

## 2024-02-03 DIAGNOSIS — Z880 Allergy status to penicillin: Secondary | ICD-10-CM | POA: Diagnosis not present

## 2024-02-03 DIAGNOSIS — Z681 Body mass index (BMI) 19 or less, adult: Secondary | ICD-10-CM

## 2024-02-03 DIAGNOSIS — E1110 Type 2 diabetes mellitus with ketoacidosis without coma: Secondary | ICD-10-CM | POA: Diagnosis present

## 2024-02-03 DIAGNOSIS — E109 Type 1 diabetes mellitus without complications: Secondary | ICD-10-CM | POA: Diagnosis present

## 2024-02-03 DIAGNOSIS — H9192 Unspecified hearing loss, left ear: Secondary | ICD-10-CM | POA: Diagnosis present

## 2024-02-03 DIAGNOSIS — K5900 Constipation, unspecified: Secondary | ICD-10-CM | POA: Diagnosis present

## 2024-02-03 DIAGNOSIS — H60502 Unspecified acute noninfective otitis externa, left ear: Secondary | ICD-10-CM | POA: Diagnosis present

## 2024-02-03 DIAGNOSIS — M549 Dorsalgia, unspecified: Secondary | ICD-10-CM | POA: Diagnosis present

## 2024-02-03 DIAGNOSIS — E1310 Other specified diabetes mellitus with ketoacidosis without coma: Secondary | ICD-10-CM | POA: Diagnosis not present

## 2024-02-03 DIAGNOSIS — Z794 Long term (current) use of insulin: Secondary | ICD-10-CM | POA: Diagnosis not present

## 2024-02-03 DIAGNOSIS — F1910 Other psychoactive substance abuse, uncomplicated: Secondary | ICD-10-CM | POA: Diagnosis present

## 2024-02-03 DIAGNOSIS — H9202 Otalgia, left ear: Secondary | ICD-10-CM | POA: Diagnosis present

## 2024-02-03 LAB — BASIC METABOLIC PANEL WITH GFR
Anion gap: 16 — ABNORMAL HIGH (ref 5–15)
BUN: 25 mg/dL — ABNORMAL HIGH (ref 6–20)
CO2: 21 mmol/L — ABNORMAL LOW (ref 22–32)
Calcium: 9.3 mg/dL (ref 8.9–10.3)
Chloride: 97 mmol/L — ABNORMAL LOW (ref 98–111)
Creatinine, Ser: 0.89 mg/dL (ref 0.61–1.24)
GFR, Estimated: >60 mL/min (ref >60)
Glucose, Bld: 303 mg/dL — ABNORMAL HIGH (ref 70–99)
Potassium: 4.7 mmol/L (ref 3.5–5.1)
Sodium: 134 mmol/L — ABNORMAL LOW (ref 135–145)

## 2024-02-03 LAB — CBC WITH DIFFERENTIAL/PLATELET
Abs Immature Granulocytes: 0.12 K/uL — ABNORMAL HIGH (ref 0.00–0.07)
Basophils Absolute: 0.1 K/uL (ref 0.0–0.1)
Basophils Relative: 0 %
Eosinophils Absolute: 0 K/uL (ref 0.0–0.5)
Eosinophils Relative: 0 %
HCT: 46.1 % (ref 39.0–52.0)
Hemoglobin: 15.6 g/dL (ref 13.0–17.0)
Immature Granulocytes: 1 %
Lymphocytes Relative: 7 %
Lymphs Abs: 1.1 K/uL (ref 0.7–4.0)
MCH: 28.7 pg (ref 26.0–34.0)
MCHC: 33.8 g/dL (ref 30.0–36.0)
MCV: 84.9 fL (ref 80.0–100.0)
Monocytes Absolute: 0.9 K/uL (ref 0.1–1.0)
Monocytes Relative: 5 %
Neutro Abs: 13.9 K/uL — ABNORMAL HIGH (ref 1.7–7.7)
Neutrophils Relative %: 87 %
Platelets: 279 K/uL (ref 150–400)
RBC: 5.43 MIL/uL (ref 4.22–5.81)
RDW: 12.2 % (ref 11.5–15.5)
WBC: 16.1 K/uL — ABNORMAL HIGH (ref 4.0–10.5)
nRBC: 0 % (ref 0.0–0.2)

## 2024-02-03 LAB — COMPREHENSIVE METABOLIC PANEL WITH GFR
ALT: 67 U/L — ABNORMAL HIGH (ref 0–44)
AST: 39 U/L (ref 15–41)
Albumin: 3.7 g/dL (ref 3.5–5.0)
Alkaline Phosphatase: 162 U/L — ABNORMAL HIGH (ref 38–126)
Anion gap: 25 — ABNORMAL HIGH (ref 5–15)
BUN: 23 mg/dL — ABNORMAL HIGH (ref 6–20)
CO2: 17 mmol/L — ABNORMAL LOW (ref 22–32)
Calcium: 9.9 mg/dL (ref 8.9–10.3)
Chloride: 86 mmol/L — ABNORMAL LOW (ref 98–111)
Creatinine, Ser: 0.97 mg/dL (ref 0.61–1.24)
GFR, Estimated: >60 mL/min (ref >60)
Glucose, Bld: 431 mg/dL — ABNORMAL HIGH (ref 70–99)
Potassium: 5.6 mmol/L — ABNORMAL HIGH (ref 3.5–5.1)
Sodium: 128 mmol/L — ABNORMAL LOW (ref 135–145)
Total Bilirubin: 1.2 mg/dL (ref 0.0–1.2)
Total Protein: 7.2 g/dL (ref 6.5–8.1)

## 2024-02-03 LAB — BLOOD GAS, VENOUS
Acid-base deficit: 5.6 mmol/L — ABNORMAL HIGH (ref 0.0–2.0)
Bicarbonate: 21.6 mmol/L (ref 20.0–28.0)
O2 Saturation: 64.3 %
Patient temperature: 37
pCO2, Ven: 47 mmHg (ref 44–60)
pH, Ven: 7.27 (ref 7.25–7.43)
pO2, Ven: 36 mmHg (ref 32–45)

## 2024-02-03 LAB — I-STAT CG4 LACTIC ACID, ED
Lactic Acid, Venous: 2.6 mmol/L (ref 0.5–1.9)
Lactic Acid, Venous: 2.4 mmol/L (ref 0.5–1.9)

## 2024-02-03 LAB — URINALYSIS, W/ REFLEX TO CULTURE (INFECTION SUSPECTED)
Bacteria, UA: NONE SEEN
Bilirubin Urine: NEGATIVE
Glucose, UA: >=500 mg/dL — AB
Hgb urine dipstick: NEGATIVE
Ketones, ur: 80 mg/dL — AB
Leukocytes,Ua: NEGATIVE
Nitrite: NEGATIVE
Protein, ur: NEGATIVE mg/dL
Specific Gravity, Urine: 1.025 (ref 1.005–1.030)
pH: 5 (ref 5.0–8.0)

## 2024-02-03 LAB — CBG MONITORING, ED
Glucose-Capillary: 399 mg/dL — ABNORMAL HIGH (ref 70–99)
Glucose-Capillary: 434 mg/dL — ABNORMAL HIGH (ref 70–99)
Glucose-Capillary: 419 mg/dL — ABNORMAL HIGH (ref 70–99)
Glucose-Capillary: 393 mg/dL — ABNORMAL HIGH (ref 70–99)
Glucose-Capillary: 303 mg/dL — ABNORMAL HIGH (ref 70–99)
Glucose-Capillary: 227 mg/dL — ABNORMAL HIGH (ref 70–99)

## 2024-02-03 LAB — URINE DRUG SCREEN
Amphetamines: NEGATIVE
Barbiturates: NEGATIVE
Benzodiazepines: NEGATIVE
Cocaine: NEGATIVE
Fentanyl: NEGATIVE
Methadone Scn, Ur: NEGATIVE
Opiates: POSITIVE — AB
Tetrahydrocannabinol: NEGATIVE

## 2024-02-03 LAB — BETA-HYDROXYBUTYRIC ACID: Beta-Hydroxybutyric Acid: 7.22 mmol/L — ABNORMAL HIGH (ref 0.05–0.27)

## 2024-02-03 MED ORDER — DEXTROSE IN LACTATED RINGERS 5 % IV SOLN: INTRAVENOUS | Status: DC

## 2024-02-03 MED ORDER — SODIUM CHLORIDE 0.9 % IV SOLN: INTRAVENOUS | Status: DC

## 2024-02-03 MED ORDER — ONDANSETRON HCL 4 MG/2ML IJ SOLN: 4.0000 mg | Freq: Four times a day (QID) | INTRAMUSCULAR | Status: DC | PRN

## 2024-02-03 MED ORDER — INSULIN REGULAR(HUMAN) IN NACL 100-0.9 UT/100ML-% IV SOLN
INTRAVENOUS | Status: DC
  Administered 2024-02-03: 10 [IU]/h via INTRAVENOUS
  Filled 2024-02-03: qty 100

## 2024-02-03 MED ORDER — ALBUTEROL SULFATE (2.5 MG/3ML) 0.083% IN NEBU: 2.5000 mg | INHALATION_SOLUTION | RESPIRATORY_TRACT | Status: DC | PRN

## 2024-02-03 MED ORDER — SODIUM CHLORIDE 0.9 % IV BOLUS
1000.0000 mL | Freq: Once | INTRAVENOUS | Status: AC
  Administered 2024-02-03: 1000 mL via INTRAVENOUS

## 2024-02-03 MED ORDER — CIPROFLOXACIN-DEXAMETHASONE 0.3-0.1 % OT SUSP
4.0000 [drp] | Freq: Once | OTIC | Status: AC
  Administered 2024-02-03: 4 [drp] via OTIC
  Filled 2024-02-03: qty 7.5

## 2024-02-03 MED ORDER — LEVOFLOXACIN IN D5W 750 MG/150ML IV SOLN
750.0000 mg | INTRAVENOUS | Status: DC
Start: 2024-02-03 — End: 2024-02-05
  Administered 2024-02-03 – 2024-02-04 (×2): 750 mg via INTRAVENOUS
  Filled 2024-02-03 (×3): qty 150

## 2024-02-03 MED ORDER — DEXTROSE 50 % IV SOLN: 0.0000 mL | INTRAVENOUS | Status: DC | PRN

## 2024-02-03 MED ORDER — MELATONIN 3 MG PO TABS: 3.0000 mg | ORAL_TABLET | Freq: Every evening | ORAL | Status: DC | PRN

## 2024-02-03 MED ORDER — ONDANSETRON HCL 4 MG PO TABS: 4.0000 mg | ORAL_TABLET | Freq: Four times a day (QID) | ORAL | Status: DC | PRN

## 2024-02-03 MED ORDER — RISAQUAD PO CAPS
2.0000 | ORAL_CAPSULE | Freq: Three times a day (TID) | ORAL | Status: DC
  Administered 2024-02-04 – 2024-02-05 (×5): 2 via ORAL
  Filled 2024-02-03 (×5): qty 2

## 2024-02-03 MED ORDER — IOHEXOL 300 MG/ML  SOLN
75.0000 mL | Freq: Once | INTRAMUSCULAR | Status: AC | PRN
  Administered 2024-02-03: 75 mL via INTRAVENOUS

## 2024-02-03 MED ORDER — LACTATED RINGERS IV SOLN: INTRAVENOUS | Status: DC

## 2024-02-03 MED ORDER — ENOXAPARIN SODIUM 40 MG/0.4ML IJ SOSY
40.0000 mg | PREFILLED_SYRINGE | INTRAMUSCULAR | Status: DC
  Administered 2024-02-03 – 2024-02-04 (×2): 40 mg via SUBCUTANEOUS
  Filled 2024-02-03 (×2): qty 0.4

## 2024-02-03 MED ORDER — MORPHINE SULFATE (PF) 4 MG/ML IV SOLN
4.0000 mg | Freq: Once | INTRAVENOUS | Status: AC
  Administered 2024-02-03: 4 mg via INTRAVENOUS
  Filled 2024-02-03: qty 1

## 2024-02-03 MED ORDER — BISMUTH SUBSALICYLATE 262 MG/15ML PO SUSP: 30.0000 mL | ORAL | Status: DC | PRN

## 2024-02-03 MED ORDER — ACETAMINOPHEN 650 MG RE SUPP: 650.0000 mg | Freq: Four times a day (QID) | RECTAL | Status: DC | PRN

## 2024-02-03 MED ORDER — LACTATED RINGERS IV BOLUS: 20.0000 mL/kg | Freq: Once | INTRAVENOUS | Status: DC

## 2024-02-03 MED ORDER — ACETAMINOPHEN 325 MG PO TABS
650.0000 mg | ORAL_TABLET | Freq: Four times a day (QID) | ORAL | Status: DC | PRN
  Administered 2024-02-04 – 2024-02-05 (×2): 650 mg via ORAL
  Filled 2024-02-03 (×2): qty 2

## 2024-02-03 NOTE — ED Notes (Signed)
 4.8 units/hour  Next Glucose Check 23:56  Due in 1 Hour

## 2024-02-03 NOTE — ED Triage Notes (Signed)
 PT arrived with Mom for recent ear infection and on last day of antibiotics without improvement.  Pt has hx of DM and currently hyperglycemic.   Body aches present, afebrile.

## 2024-02-03 NOTE — ED Provider Notes (Signed)
 [place] EMERGENCY DEPARTMENT AT [facility] Provider Note   CSN: [id number] Arrival date & time: 02/03/24  1423     Patient presents with: Hyperglycemia and Otalgia (Tx by PCP  no relief)   Ryan Richard is a 48 y.o. male.   Patient with history of T1DM presents today with complaints of otalgia. Patient is reportedly on the spectrum and therefore a majority of history provided by mom at the bedside. Does report that he lives alone and cares for himself. Reports that ear pain has been ongoing for approximately 1 week. Went to urgent care and was diagnosed with otitis media and prescribed azithromycin. Took his last dose prior to arrival, denies any improvement. Pain is worse in his left ear. Denies head trauma, no fevers. No history of ear infections previously. Does also report a headache. Denies neck pain or stiffness. Denies vision changes, nausea, or vomiting. Does report that since he has been sick he has only been taking his short acting insulin  and has not been checking his blood sugar.  Denies chest pain, shortness of breath, abdominal pain.  The history is provided by the patient. No language interpreter was used.  Hyperglycemia Otalgia      Prior to Admission medications   Medication Sig Start Date End Date Taking? Authorizing Provider  bacitracin  ointment Apply 1 Application topically 2 (two) times daily. 11/03/23   Silver Wonda LABOR, PA  celecoxib  (CELEBREX ) 200 MG capsule Take 1 capsule (200 mg total) by mouth 2 (two) times daily as needed. 11/03/23   Silver Wonda LABOR, PA  diclofenac  (VOLTAREN ) 75 MG EC tablet Take 1 tablet (75 mg total) by mouth 3 (three) times daily as needed. 01/06/23   Towana Small, FNP  insulin  aspart (NOVOLOG ) 100 UNIT/ML injection Inject 10 Units into the skin 3 (three) times daily before meals. Sliding scale 09/30/23   Prosperi, Christian H, PA-C  insulin  glargine (LANTUS ) 100 UNIT/ML injection Inject 0.4 mLs (40 Units total) into the  skin daily. 09/30/23   Prosperi, Christian H, PA-C  insulin  NPH-regular Human (70-30) 100 UNIT/ML injection Inject 25 Units into the skin 2 (two) times daily with a meal.    [provider]    Allergies: Penicillins    Review of Systems  HENT:  Positive for ear pain.   All other systems reviewed and are negative.   Updated Vital Signs BP 122/89 (BP Location: Left Arm)   Pulse (!) 115   Temp 98.5 F (36.9 C) (Oral)   Resp 16   SpO2 96%   Physical Exam Vitals and nursing note reviewed.  Constitutional:      General: He is not in acute distress.    Appearance: Normal appearance. He is normal weight. He is not ill-appearing, toxic-appearing or diaphoretic.  HENT:     Head: Normocephalic and atraumatic.     Comments: Left ear with erythematous TM with effusion. TM is intact. Does have some mild swelling to the [facility] without drainage. +mastoid tenderness on the left. No fluctuance Cardiovascular:     Rate and Rhythm: Normal rate.  Pulmonary:     Effort: Pulmonary effort is normal. No respiratory distress.  Musculoskeletal:        General: Normal range of motion.     Cervical back: Normal range of motion.  Skin:    General: Skin is warm and dry.  Neurological:     General: No focal deficit present.     Mental Status: He is alert.  Psychiatric:  Mood and Affect: Mood normal.        Behavior: Behavior normal.     (all labs ordered are listed, but only abnormal results are displayed) Labs Reviewed  COMPREHENSIVE METABOLIC PANEL WITH GFR - Abnormal; Notable for the following components:      Result Value   Sodium 128 (*)    Potassium 5.6 (*)    Chloride 86 (*)    CO2 17 (*)    Glucose, Bld 431 (*)    BUN 23 (*)    ALT 67 (*)    Alkaline Phosphatase 162 (*)    Anion gap 25 (*)    All other components within normal limits  CBC WITH DIFFERENTIAL/PLATELET - Abnormal; Notable for the following components:   WBC 16.1 (*)    Neutro Abs 13.9 (*)    Abs Immature  Granulocytes 0.12 (*)    All other components within normal limits  URINALYSIS, W/ REFLEX TO CULTURE (INFECTION SUSPECTED) - Abnormal; Notable for the following components:   Glucose, UA >=500 (*)    Ketones, ur 80 (*)    All other components within normal limits  BLOOD GAS, VENOUS - Abnormal; Notable for the following components:   Acid-base deficit 5.6 (*)    All other components within normal limits  CBG MONITORING, ED - Abnormal; Notable for the following components:   Glucose-Capillary 399 (*)    All other components within normal limits  I-STAT CG4 LACTIC ACID, ED - Abnormal; Notable for the following components:   Lactic Acid, Venous 2.6 (*)    All other components within normal limits  CBG MONITORING, ED - Abnormal; Notable for the following components:   Glucose-Capillary 434 (*)    All other components within normal limits  CBG MONITORING, ED - Abnormal; Notable for the following components:   Glucose-Capillary 419 (*)    All other components within normal limits  CBG MONITORING, ED - Abnormal; Notable for the following components:   Glucose-Capillary 393 (*)    All other components within normal limits  CULTURE, BLOOD (ROUTINE X 2)  CULTURE, BLOOD (ROUTINE X 2)  BETA-HYDROXYBUTYRIC ACID  URINE DRUG SCREEN  I-STAT CG4 LACTIC ACID, ED    EKG: None  Radiology: CT Temporal Bones W Contrast Result Date: 02/03/2024 EXAM: CT TEMPORAL BONES WITH CONTRAST 02/03/2024 06:18:21 PM TECHNIQUE: CT of the temporal bones was performed with the administration of intravenous contrast. 75mL iohexol  (OMNIPAQUE ) 300 MG/ML solution was used. Multiplanar reformatted images are provided for review. Automated exposure control, iterative reconstruction, and/or weight based adjustment of the mA/kV was utilized to reduce the radiation dose to as low as reasonably achievable. COMPARISON: None available. CLINICAL HISTORY: Mastoiditis. Notes from triage: PT arrived with Mom for recent ear infection  and on last day of antibiotics without improvement. Pt has hx of DM and currently hyperglycemic. Body aches present, afebrile. FINDINGS: RIGHT TEMPORAL BONE: EXTERNAL AUDITORY CANAL: Clear. No bony erosion. Scutum is intact. MIDDLE EAR CAVITY: Clear. Ossicular chain is intact. MASTOID AIR CELLS: Clear. INNER EAR: The cochlea and vestibule are unremarkable. Normal mineralization of the otic capsule. Normal semicircular canals. The vestibular aqueduct is not dilated. INTERNAL AUDITORY CANAL: Unremarkable. Normal bony canal of the facial nerve. LEFT TEMPORAL BONE: EXTERNAL AUDITORY CANAL: Soft tissue thickening in the external auditory canal. MIDDLE EAR CAVITY: Left middle ear effusion. Ossicles are intact. Tegmen Tympani is intact. MASTOID AIR CELLS: Left mastoid effusion without coalescence. INNER EAR: The cochlea and vestibule are unremarkable. Normal mineralization of the otic  capsule. Normal semicircular canals. The vestibular aqueduct is not dilated. INTERNAL AUDITORY CANAL: Unremarkable. Normal bony canal of the facial nerve. VASCULAR: Normal jugular bulbs. Normal carotid canals. BRAIN: Unremarkable. ORBITS: No acute abnormality. SINUSES: Mild paranasal sinus mucosal thickening. IMPRESSION: 1. Left otomastoidiits without coalescence or infratemporal abscess 2. Soft tissue thickening in the left external auditory canal which may indicate otitis externa . Electronically signed by: Franky Stanford MD 02/03/2024 07:20 PM EDT RP Workstation: [id number]   CT Head W or Wo Contrast Result Date: 02/03/2024 CLINICAL DATA:  Headache, sudden, severe.  Recent ear infection. EXAM: CT HEAD WITHOUT AND WITH CONTRAST TECHNIQUE: Contiguous axial images were obtained from the base of the skull through the vertex without and with intravenous contrast. RADIATION DOSE REDUCTION: This exam was performed according to the departmental dose-optimization program which includes automated exposure control, adjustment of the mA and/or kV  according to patient size and/or use of iterative reconstruction technique. CONTRAST:  75mL OMNIPAQUE  IOHEXOL  300 MG/ML  SOLN COMPARISON:  Head CT 09/30/2023 FINDINGS: Brain: There is no evidence of an acute infarct, intracranial hemorrhage, mass, midline shift, or extra-axial fluid collection. Cerebral volume is normal. The ventricles are normal in size. No abnormal enhancement is identified. Vascular: The major dural venous sinuses and large intracranial arteries are grossly patent on this nondedicated study. Skull: No fracture or suspicious lesion. Sinuses/Orbits: Mild mucosal thickening in the included paranasal sinuses. Left mastoid and left middle ear opacification, more fully evaluated on the separate dedicated CT of the temporal bones. Unremarkable orbits. Other: None. IMPRESSION: 1. No evidence of acute intracranial abnormality. 2. Temporal bone CT reported separately. Electronically Signed   By: Dasie Hamburg M.D.   On: 02/03/2024 18:39     .Critical Care  Performed by: Nora Lauraine LABOR, PA-C Authorized by: Antonya Leeder A, PA-C   Critical care provider statement:    Critical care time (minutes):  35   Critical care was necessary to treat or prevent imminent or life-threatening deterioration of the following conditions:  Metabolic crisis   Critical care was time spent personally by me on the following activities:  Development of treatment plan with patient or surrogate, discussions with consultants, discussions with primary provider, evaluation of patient's response to treatment, obtaining history from patient or surrogate, ordering and review of laboratory studies, ordering and review of radiographic studies, pulse oximetry, re-evaluation of patient's condition and review of old charts   Care discussed with: admitting provider      Medications Ordered in the ED  lactated ringers  bolus 20 mL/kg (has no administration in time range)  insulin  regular, human (MYXREDLIN ) 100 units/ 100 mL infusion  (has no administration in time range)  lactated ringers  infusion (has no administration in time range)  dextrose  5 % in lactated ringers  infusion (has no administration in time range)  dextrose  50 % solution 0-50 mL (has no administration in time range)  morphine  (PF) 4 MG/ML injection 4 mg (4 mg Intravenous Given 02/03/24 1748)  sodium chloride  0.9 % bolus 1,000 mL (1,000 mLs Intravenous New Bag/Given 02/03/24 1747)  iohexol  (OMNIPAQUE ) 300 MG/ML solution 75 mL (75 mLs Intravenous Contrast Given 02/03/24 1802)                                    Medical Decision Making Amount and/or Complexity of Data Reviewed Labs: ordered. Radiology: ordered.  Risk Prescription drug management.   This patient is a 48 y.o.  male who presents to the ED for concern of left ear pain, hyperglycemia, this involves an extensive number of treatment options, and is a complaint that carries with it a high risk of complications and morbidity. The emergent differential diagnosis prior to evaluation includes, but is not limited to, otitis media, otitis externa, mastoiditis, malignant otitis externa.  DKA/HHS, sepsis. This is not an exhaustive differential.   Past Medical History / Co-morbidities / Social History:  has a past medical history of Chronic back pain, Diabetes mellitus without complication (HCC), Open displaced fracture of shaft of fourth metacarpal bone of left hand, and Open displaced fracture of shaft of third metacarpal bone of left hand.  Additional history: Chart reviewed.  Physical Exam: Physical exam performed. The pertinent findings include:   Left ear with erythematous TM with effusion. TM is intact. Does have some mild swelling to the [facility] without drainage. +mastoid tenderness on the left. No fluctuance  Lab Tests: I ordered, and personally interpreted labs.  The pertinent results include:  WBC 16.1, Na 128, K 5.6, chloride 86, bicarb 17, glucose 431, anion gap 25, consistent with DKA. ALT  67, Alk phos 162 consistent with previous. UA with ketones, lactic 2.6   Imaging Studies: I ordered imaging studies including CT head, CT temporal bones. I independently visualized and interpreted imaging which showed   CT head: no acute findings  CT temporal bones: 1. Left otomastoidiits without coalescence or infratemporal abscess 2. Soft tissue thickening in the left external auditory canal which may indicate otitis externa .  I agree with the radiologist interpretation.   Medications: I ordered medication including IV fluids, insulin , morphine , ciprodex  drops  for DKA, pain, otitis externa. Reevaluation of the patient after these medicines showed that the patient improved. I have reviewed the patients home medicines and have made adjustments as needed.  Consultations Obtained: I requested consultation with the ENT on call Dr. Jesus,  and discussed lab and imaging findings as well as pertinent plan - they recommend: suspect otitis externa, recommend ciprodex  drops, do not feel IV antibiotics are indicated at this time. Recommend reconsulting if patients symptoms do not improve in a few days    Disposition: After consideration of the diagnostic results and the patients response to treatment, I feel that patient will require admission for DKA. Discussed plan with patient and family at bedside who are understanding and in agreement with this.  Discussed patient with hospitalist Dr. Claiborne who accepts patient for admission.  Findings and plan of care discussed with supervising physician Dr. Armenta who is in agreement.   Final diagnoses:  Diabetic ketoacidosis without coma associated with type 1 diabetes mellitus (HCC)  Acute otitis externa of left ear, unspecified type    ED Discharge Orders     None          Nora Lauraine DELENA DEVONNA 02/03/24 2013    Armenta Canning, MD 02/04/24 1328

## 2024-02-03 NOTE — H&P (Signed)
 History and Physical    Patient: Ryan Richard FMW:[id number] DOB: [birth year] DOA: 02/03/2024 DOS: the patient was seen and examined on 02/03/2024 PCP: [facility]  Patient coming from: Home  Chief Complaint:  Chief Complaint  Patient presents with   Hyperglycemia   Otalgia    Tx by PCP  no relief   HPI: [name] is a 48 y.o. 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  He presents with 8 days of severe left ear pain and headache headache, postnasal drip, severe left ear pain, and some mild sore throat. He has been sleeping a lot.  He has continued to take his daily Lantus  but has not been taking the mealtime short acting insulin .  The patient was seen in clinic and prescribed erythromycin but he has not improved despite full course.  He denies chest pain or shortness of breath.  He denies fevers at home. In the ED his blood work revealed that he was in DKA.  He also had a CT scan of his temporal bones which revealed left-sided otomastoiditis and left otitis externa.  The ED provider did speak with ENT who recommended drops for the otitis externa.  He recommended consultation only if the patient does not improve with that therapy.  The patient was started on insulin  drip because of the DKA and he will be admitted by the hospitalist service. His white blood cell count was elevated at 15 but he was afebrile.  Review of Systems: As mentioned in the history of present illness. All other systems reviewed and are negative. Past Medical History:  Diagnosis Date   Chronic back pain    Diabetes mellitus without complication (HCC)    Open displaced fracture of shaft of fourth metacarpal bone of left hand    Open displaced fracture of shaft of third metacarpal bone of left hand    Past Surgical History:  Procedure Laterality Date   HERNIA REPAIR     open heart surgery- valve     OPEN REDUCTION INTERNAL FIXATION (ORIF) METACARPAL Left 06/26/2021    Procedure: OPEN REDUCTION INTERNAL FIXATION OF THIRD AND FOURTH METACARPAL;  Surgeon: Romona Harari, MD;  Location: [facility];  Service: Orthopedics;  Laterality: Left;  Pre-operative Block   Social History:  reports that he has quit smoking. His smoking use included cigarettes. He has never used smokeless tobacco. He reports that he does not currently use alcohol. He reports that he does not currently use drugs.  Allergies  Allergen Reactions   Penicillins Hives    Family History  Problem Relation Age of Onset   Diabetes Mellitus II Maternal Grandmother    Diabetes Mellitus II Paternal Grandmother     Prior to Admission medications   Medication Sig Start Date End Date Taking? Authorizing Provider  bacitracin  ointment Apply 1 Application topically 2 (two) times daily. 11/03/23   Silver Wonda LABOR, PA  celecoxib  (CELEBREX ) 200 MG capsule Take 1 capsule (200 mg total) by mouth 2 (two) times daily as needed. 11/03/23   Silver Wonda LABOR, PA  diclofenac  (VOLTAREN ) 75 MG EC tablet Take 1 tablet (75 mg total) by mouth 3 (three) times daily as needed. 01/06/23   Towana Small, FNP  insulin  aspart (NOVOLOG ) 100 UNIT/ML injection Inject 10 Units into the skin 3 (three) times daily before meals. Sliding scale 09/30/23   Prosperi, Christian H, PA-C  insulin  glargine (LANTUS ) 100 UNIT/ML injection Inject 0.4 mLs (40 Units total) into  the skin daily. 09/30/23   Prosperi, Christian H, PA-C  insulin  NPH-regular Human (70-30) 100 UNIT/ML injection Inject 25 Units into the skin 2 (two) times daily with a meal.    [provider]    Physical Exam: Vitals:   02/03/24 1447 02/03/24 1457 02/03/24 1759  BP: 110/81  122/89  Pulse: (!) 119  (!) 115  Resp: (!) 24  16  Temp: 98.2 F (36.8 C)  98.5 F (36.9 C)  TempSrc: Oral  Oral  SpO2: 100%  96%  Weight:  68 kg    Physical Exam:  General: Ill appearing, malnourished HEENT: Normocephalic, atraumatic, Lips are dry and cracked,  PERRL Cardiovascular: Normal rate and rhythm. Distal pulses intact. Pulmonary: Normal pulmonary effort, normal breath sounds Gastrointestinal: Nondistended abdomen, soft, non-tender, hypoactive bowel sounds, scaphoid Musculoskeletal:Normal ROM, no lower ext edema Lymphadenopathy: No cervical LAD. Skin: Skin is warm and dry. Neuro: No focal deficits noted, AAOx3. PSYCH: Attentive and cooperative  Data Reviewed:  Results for orders placed or performed during the hospital encounter of 02/03/24 (from the past 24 hours)  CBG monitoring, ED     Status: Abnormal   Collection Time: 02/03/24  2:52 PM  Result Value Ref Range   Glucose-Capillary 399 (H) 70 - 99 mg/dL   Comment 1 Notify RN   Comprehensive metabolic panel     Status: Abnormal   Collection Time: 02/03/24  3:26 PM  Result Value Ref Range   Sodium 128 (L) 135 - 145 mmol/L   Potassium 5.6 (H) 3.5 - 5.1 mmol/L   Chloride 86 (L) 98 - 111 mmol/L   CO2 17 (L) 22 - 32 mmol/L   Glucose, Bld 431 (H) 70 - 99 mg/dL   BUN 23 (H) 6 - 20 mg/dL   Creatinine, Ser 9.02 0.61 - 1.24 mg/dL   Calcium 9.9 8.9 - 89.6 mg/dL   Total Protein 7.2 6.5 - 8.1 g/dL   Albumin 3.7 3.5 - 5.0 g/dL   AST 39 15 - 41 U/L   ALT 67 (H) 0 - 44 U/L   Alkaline Phosphatase 162 (H) 38 - 126 U/L   Total Bilirubin 1.2 0.0 - 1.2 mg/dL   GFR, Estimated >39 >39 mL/min   Anion gap 25 (H) 5 - 15  CBC with Differential     Status: Abnormal   Collection Time: 02/03/24  3:26 PM  Result Value Ref Range   WBC 16.1 (H) 4.0 - 10.5 K/uL   RBC 5.43 4.22 - 5.81 MIL/uL   Hemoglobin 15.6 13.0 - 17.0 g/dL   HCT 53.8 60.9 - 47.9 %   MCV 84.9 80.0 - 100.0 fL   MCH 28.7 26.0 - 34.0 pg   MCHC 33.8 30.0 - 36.0 g/dL   RDW 87.7 88.4 - 84.4 %   Platelets 279 150 - 400 K/uL   nRBC 0.0 0.0 - 0.2 %   Neutrophils Relative % 87 %   Neutro Abs 13.9 (H) 1.7 - 7.7 K/uL   Lymphocytes Relative 7 %   Lymphs Abs 1.1 0.7 - 4.0 K/uL   Monocytes Relative 5 %   Monocytes Absolute 0.9 0.1 - 1.0  K/uL   Eosinophils Relative 0 %   Eosinophils Absolute 0.0 0.0 - 0.5 K/uL   Basophils Relative 0 %   Basophils Absolute 0.1 0.0 - 0.1 K/uL   Immature Granulocytes 1 %   Abs Immature Granulocytes 0.12 (H) 0.00 - 0.07 K/uL  Urinalysis, w/ Reflex to Culture (Infection Suspected) -Urine, Clean Catch  Status: Abnormal   Collection Time: 02/03/24  4:53 PM  Result Value Ref Range   Specimen Source URINE, CLEAN CATCH    Color, Urine YELLOW YELLOW   APPearance CLEAR CLEAR   Specific Gravity, Urine 1.025 1.005 - 1.030   pH 5.0 5.0 - 8.0   Glucose, UA >=500 (A) NEGATIVE mg/dL   Hgb urine dipstick NEGATIVE NEGATIVE   Bilirubin Urine NEGATIVE NEGATIVE   Ketones, ur 80 (A) NEGATIVE mg/dL   Protein, ur NEGATIVE NEGATIVE mg/dL   Nitrite NEGATIVE NEGATIVE   Leukocytes,Ua NEGATIVE NEGATIVE   RBC / HPF 0-5 0 - 5 RBC/hpf   WBC, UA 0-5 0 - 5 WBC/hpf   Bacteria, UA NONE SEEN NONE SEEN   Squamous Epithelial / HPF 0-5 0 - 5 /HPF   Mucus PRESENT   CBG monitoring, ED     Status: Abnormal   Collection Time: 02/03/24  5:45 PM  Result Value Ref Range   Glucose-Capillary 434 (H) 70 - 99 mg/dL  I-Stat Lactic Acid, ED     Status: Abnormal   Collection Time: 02/03/24  5:51 PM  Result Value Ref Range   Lactic Acid, Venous 2.6 (HH) 0.5 - 1.9 mmol/L   Comment NOTIFIED PHYSICIAN   Blood gas, venous (at [facility])     Status: Abnormal   Collection Time: 02/03/24  5:55 PM  Result Value Ref Range   pH, Ven 7.27 7.25 - 7.43   pCO2, Ven 47 44 - 60 mmHg   pO2, Ven 36 32 - 45 mmHg   Bicarbonate 21.6 20.0 - 28.0 mmol/L   Acid-base deficit 5.6 (H) 0.0 - 2.0 mmol/L   O2 Saturation 64.3 %   Patient temperature 37.0   CBG monitoring, ED     Status: Abnormal   Collection Time: 02/03/24  6:53 PM  Result Value Ref Range   Glucose-Capillary 419 (H) 70 - 99 mg/dL  CBG monitoring, ED     Status: Abnormal   Collection Time: 02/03/24  7:46 PM  Result Value Ref Range   Glucose-Capillary 393 (H) 70 - 99 mg/dL     Temporal CT IMPRESSION: 1. Left otomastoidiits without coalescence or infratemporal abscess 2. Soft tissue thickening in the left external auditory canal which may indicate otitis externa .   Assessment and Plan: DKA  - Patient presenting with anion gap of 25 and pH of 7.27 consistent with diabetic ketoacidosis in the setting of diabetes mellitus type 1 Mastoiditis is likely the precipitating event Patient's been placed on insulin  infusion Patient is being hydrated aggressively with intravenous isotonic fluids Performing serial chemistries and serial beta hydroxybutyrate levels Patient ordered clear liquid diet Patient will remain on insulin  infusion until anion gap is closed   2. Left Otomastoiditis and otitis externa -  - Cipradex drops per Ent - Levaquin  given penicillin allergy - If patient does not improve consider ENT consultation  3.  Severe protein calorie malnutrition - Patient reports he no longer is an IV drug user.  Sober x 2 years. - UDS ordered   Advance Care Planning:   Code Status: Prior the patient names his mother as a surrogate decision maker and wants to be full code.  Consults: none  Family Communication: None  Severity of Illness: The appropriate patient status for this patient is INPATIENT. Inpatient status is judged to be reasonable and necessary in order to provide the required intensity of service to ensure the patient's safety. The patient's presenting symptoms, physical exam findings, and initial radiographic  and laboratory data in the context of their chronic comorbidities is felt to place them at high risk for further clinical deterioration. Furthermore, it is not anticipated that the patient will be medically stable for discharge from the hospital within 2 midnights of admission.   * I certify that at the point of admission it is my clinical judgment that the patient will require inpatient hospital care spanning beyond 2 midnights from the point  of admission due to high intensity of service, high risk for further deterioration and high frequency of surveillance required.*  Author: ARTHEA CHILD, MD 02/03/2024 7:54 PM  For on call review [email or web address].

## 2024-02-03 NOTE — ED Notes (Signed)
 Unit is currently is out of LR, Hospitalist made aware, per MD okay to wait to start LR.

## 2024-02-04 ENCOUNTER — Other Ambulatory Visit: Payer: Self-pay

## 2024-02-04 ENCOUNTER — Encounter (HOSPITAL_COMMUNITY): Payer: Self-pay | Admitting: Internal Medicine

## 2024-02-04 DIAGNOSIS — E1310 Other specified diabetes mellitus with ketoacidosis without coma: Secondary | ICD-10-CM

## 2024-02-04 LAB — BASIC METABOLIC PANEL WITH GFR
Anion gap: 10 (ref 5–15)
Anion gap: 17 — ABNORMAL HIGH (ref 5–15)
Anion gap: 13 (ref 5–15)
Anion gap: 9 (ref 5–15)
BUN: 24 mg/dL — ABNORMAL HIGH (ref 6–20)
BUN: 24 mg/dL — ABNORMAL HIGH (ref 6–20)
BUN: 22 mg/dL — ABNORMAL HIGH (ref 6–20)
BUN: 22 mg/dL — ABNORMAL HIGH (ref 6–20)
CO2: 23 mmol/L (ref 22–32)
CO2: 18 mmol/L — ABNORMAL LOW (ref 22–32)
CO2: 24 mmol/L (ref 22–32)
CO2: 27 mmol/L (ref 22–32)
Calcium: 9.4 mg/dL (ref 8.9–10.3)
Calcium: 9.5 mg/dL (ref 8.9–10.3)
Calcium: 9.3 mg/dL (ref 8.9–10.3)
Calcium: 9.3 mg/dL (ref 8.9–10.3)
Chloride: 99 mmol/L (ref 98–111)
Chloride: 98 mmol/L (ref 98–111)
Chloride: 93 mmol/L — ABNORMAL LOW (ref 98–111)
Chloride: 96 mmol/L — ABNORMAL LOW (ref 98–111)
Creatinine, Ser: 0.77 mg/dL (ref 0.61–1.24)
Creatinine, Ser: 0.78 mg/dL (ref 0.61–1.24)
Creatinine, Ser: 1.03 mg/dL (ref 0.61–1.24)
Creatinine, Ser: 0.96 mg/dL (ref 0.61–1.24)
GFR, Estimated: >60 mL/min (ref >60)
GFR, Estimated: >60 mL/min (ref >60)
GFR, Estimated: >60 mL/min (ref >60)
GFR, Estimated: >60 mL/min (ref >60)
Glucose, Bld: 192 mg/dL — ABNORMAL HIGH (ref 70–99)
Glucose, Bld: 193 mg/dL — ABNORMAL HIGH (ref 70–99)
Glucose, Bld: 481 mg/dL — ABNORMAL HIGH (ref 70–99)
Glucose, Bld: 269 mg/dL — ABNORMAL HIGH (ref 70–99)
Potassium: 5.4 mmol/L — ABNORMAL HIGH (ref 3.5–5.1)
Potassium: 5.4 mmol/L — ABNORMAL HIGH (ref 3.5–5.1)
Potassium: 5 mmol/L (ref 3.5–5.1)
Potassium: 4.3 mmol/L (ref 3.5–5.1)
Sodium: 131 mmol/L — ABNORMAL LOW (ref 135–145)
Sodium: 133 mmol/L — ABNORMAL LOW (ref 135–145)
Sodium: 130 mmol/L — ABNORMAL LOW (ref 135–145)
Sodium: 132 mmol/L — ABNORMAL LOW (ref 135–145)

## 2024-02-04 LAB — GLUCOSE, CAPILLARY
Glucose-Capillary: 124 mg/dL — ABNORMAL HIGH (ref 70–99)
Glucose-Capillary: 132 mg/dL — ABNORMAL HIGH (ref 70–99)
Glucose-Capillary: 135 mg/dL — ABNORMAL HIGH (ref 70–99)
Glucose-Capillary: 140 mg/dL — ABNORMAL HIGH (ref 70–99)
Glucose-Capillary: 162 mg/dL — ABNORMAL HIGH (ref 70–99)
Glucose-Capillary: 168 mg/dL — ABNORMAL HIGH (ref 70–99)
Glucose-Capillary: 187 mg/dL — ABNORMAL HIGH (ref 70–99)
Glucose-Capillary: 188 mg/dL — ABNORMAL HIGH (ref 70–99)
Glucose-Capillary: 229 mg/dL — ABNORMAL HIGH (ref 70–99)
Glucose-Capillary: 263 mg/dL — ABNORMAL HIGH (ref 70–99)
Glucose-Capillary: 289 mg/dL — ABNORMAL HIGH (ref 70–99)
Glucose-Capillary: 345 mg/dL — ABNORMAL HIGH (ref 70–99)
Glucose-Capillary: 424 mg/dL — ABNORMAL HIGH (ref 70–99)
Glucose-Capillary: 434 mg/dL — ABNORMAL HIGH (ref 70–99)
Glucose-Capillary: 471 mg/dL — ABNORMAL HIGH (ref 70–99)

## 2024-02-04 LAB — CBC
HCT: 38.8 % — ABNORMAL LOW (ref 39.0–52.0)
Hemoglobin: 13 g/dL (ref 13.0–17.0)
MCH: 27.9 pg (ref 26.0–34.0)
MCHC: 33.5 g/dL (ref 30.0–36.0)
MCV: 83.3 fL (ref 80.0–100.0)
Platelets: 250 K/uL (ref 150–400)
RBC: 4.66 MIL/uL (ref 4.22–5.81)
RDW: 12.1 % (ref 11.5–15.5)
WBC: 13.6 K/uL — ABNORMAL HIGH (ref 4.0–10.5)
nRBC: 0 % (ref 0.0–0.2)

## 2024-02-04 LAB — MAGNESIUM: Magnesium: 2.1 mg/dL (ref 1.7–2.4)

## 2024-02-04 LAB — MRSA NEXT GEN BY PCR, NASAL: MRSA by PCR Next Gen: NOT DETECTED

## 2024-02-04 LAB — HIV ANTIBODY (ROUTINE TESTING W REFLEX): HIV Screen 4th Generation wRfx: NONREACTIVE

## 2024-02-04 MED ORDER — INSULIN ASPART 100 UNIT/ML IJ SOLN
10.0000 [IU] | Freq: Once | INTRAMUSCULAR | Status: AC
  Administered 2024-02-04: 10 [IU] via SUBCUTANEOUS

## 2024-02-04 MED ORDER — BOOST / RESOURCE BREEZE PO LIQD CUSTOM
1.0000 | Freq: Three times a day (TID) | ORAL | Status: DC
  Administered 2024-02-04 – 2024-02-05 (×4): 1 via ORAL

## 2024-02-04 MED ORDER — CHLORHEXIDINE GLUCONATE CLOTH 2 % EX PADS
6.0000 | MEDICATED_PAD | Freq: Every day | CUTANEOUS | Status: DC
  Administered 2024-02-04 – 2024-02-05 (×2): 6 via TOPICAL

## 2024-02-04 MED ORDER — INSULIN GLARGINE 100 UNIT/ML ~~LOC~~ SOLN
20.0000 [IU] | SUBCUTANEOUS | Status: DC
Start: 2024-02-04 — End: 2024-02-04
  Administered 2024-02-04: 20 [IU] via SUBCUTANEOUS
  Filled 2024-02-04: qty 0.2

## 2024-02-04 MED ORDER — INSULIN ASPART 100 UNIT/ML IJ SOLN
0.0000 [IU] | Freq: Every day | INTRAMUSCULAR | Status: DC
  Administered 2024-02-04: 3 [IU] via SUBCUTANEOUS

## 2024-02-04 MED ORDER — INSULIN ASPART 100 UNIT/ML IJ SOLN
0.0000 [IU] | Freq: Three times a day (TID) | INTRAMUSCULAR | Status: DC
  Administered 2024-02-04: 1 [IU] via SUBCUTANEOUS
  Administered 2024-02-04: 9 [IU] via SUBCUTANEOUS
  Administered 2024-02-05 (×2): 3 [IU] via SUBCUTANEOUS

## 2024-02-04 MED ORDER — ORAL CARE MOUTH RINSE: 15.0000 mL | OROMUCOSAL | Status: DC | PRN

## 2024-02-04 MED ORDER — INSULIN ASPART 100 UNIT/ML IJ SOLN
20.0000 [IU] | Freq: Once | INTRAMUSCULAR | Status: AC
Start: 2024-02-04 — End: 2024-02-04
  Administered 2024-02-04: 20 [IU] via SUBCUTANEOUS

## 2024-02-04 MED ORDER — INSULIN GLARGINE 100 UNIT/ML ~~LOC~~ SOLN
20.0000 [IU] | Freq: Two times a day (BID) | SUBCUTANEOUS | Status: DC
  Administered 2024-02-04 – 2024-02-05 (×2): 20 [IU] via SUBCUTANEOUS
  Filled 2024-02-04 (×3): qty 0.2

## 2024-02-04 MED ORDER — PNEUMOCOCCAL 20-VAL CONJ VACC 0.5 ML IM SUSY
0.5000 mL | PREFILLED_SYRINGE | INTRAMUSCULAR | Status: DC
  Filled 2024-02-04: qty 0.5

## 2024-02-04 MED ORDER — KETOROLAC TROMETHAMINE 15 MG/ML IJ SOLN
15.0000 mg | Freq: Four times a day (QID) | INTRAMUSCULAR | Status: DC | PRN
  Administered 2024-02-04 – 2024-02-05 (×5): 30 mg via INTRAVENOUS
  Filled 2024-02-04 (×5): qty 2

## 2024-02-04 NOTE — Progress Notes (Signed)
 PROGRESS NOTE    Ryan Richard  FMW:[id number] DOB: [birth year] DOA: 02/03/2024 PCP: [facility]   Brief Narrative:  Ryan Richard is a 48 y.o. 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  He presents with 8 days of severe left ear pain and headache headache, postnasal drip, severe left ear pain, and some mild sore throat.  Patient found to be in DKA at intake, CT confirms mastoiditis/otitis externa likely the cause of patient's uncontrolled glucose.  Hospitalist called for admission.  Assessment & Plan:   Principal Problem:   DKA (diabetic ketoacidosis) (HCC) Active Problems:   Type 1 diabetes mellitus (HCC)   Constipation   Polysubstance abuse (HCC)  Anion gap metabolic acidosis secondary to diabetic ketoacidosis -Anion gap closed overnight, patient transition to p.o. and long-acting insulin  -Unfortunately anion gap appears to be increasing this afternoon, follow repeat labs - May need to restart insulin  drip if anion gap opens again -Glucose continues to be poorly controlled after attempting diet -Adjust sliding scale as appropriate   Severe sepsis secondary to mastoiditis and concurrent left otitis externa  - Failure of outpatient antibiotics with erythromycin - Continue Levaquin  given penicillin allergy - Continue Ciprodex  per ENT - Lactic acid downtrending appropriately  Severe protein calorie malnutrition - Patient reports he no longer is an IV drug user.  Sober x 2 years. - UDS remarkable for opiates-unclear if secondary to morphine  given in the ED   DVT prophylaxis: enoxaparin  (LOVENOX ) injection 40 mg Start: 02/03/24 2200 Code Status:   Code Status: Full Code Family Communication: None present  Status is: Inpatient  Dispo: The patient is from: Home              Anticipated d/c is to: Home              Anticipated d/c date is: 24 to 48 hours              Patient currently not medically stable for  discharge  Consultants:  None  Procedures:  None  Antimicrobials:  Levaquin   Subjective: No acute issues or events overnight, tolerating p.o. quite well, denies nausea vomiting diarrhea constipation headache fevers chills or chest pain  Objective: Vitals:   02/04/24 0200 02/04/24 0300 02/04/24 0508 02/04/24 0700  BP: 132/77 129/84 (!) 179/80 (!) 148/87  Pulse: 96 98 89 85  Resp: (!) 9 18 10  (!) 7  Temp:      TempSrc:      SpO2: 96% 97% 98% 99%  Weight:        Intake/Output Summary (Last 24 hours) at 02/04/2024 0739 Last data filed at 02/04/2024 0330 Gross per 24 hour  Intake 2294.79 ml  Output 950 ml  Net 1344.79 ml   Filed Weights   02/03/24 1457  Weight: 68 kg    Examination:  General:  Pleasantly resting in bed, No acute distress. HEENT:  Normocephalic atraumatic.  Sclerae nonicteric, noninjected.  Extraocular movements intact bilaterally. Neck:  Without mass or deformity.  Trachea is midline. Lungs:  Clear to auscultate bilaterally without rhonchi, wheeze, or rales. Heart:  Regular rate and rhythm.  Without murmurs, rubs, or gallops. Abdomen:  Soft, nontender, nondistended.  Without guarding or rebound. Extremities: Without cyanosis, clubbing, edema, or obvious deformity. Skin:  Warm and dry, no erythema.  Data Reviewed: I have personally reviewed following labs and imaging studies  CBC: Recent Labs  Lab 02/03/24 1526 02/04/24 0530  WBC 16.1* 13.6*  NEUTROABS  13.9*  --   HGB 15.6 13.0  HCT 46.1 38.8*  MCV 84.9 83.3  PLT 279 250   Basic Metabolic Panel: Recent Labs  Lab 02/03/24 1526 02/03/24 2204 02/04/24 0128 02/04/24 0530  NA 128* 134* 131* 133*  K 5.6* 4.7 5.4* 5.4*  CL 86* 97* 99 98  CO2 17* 21* 23 18*  GLUCOSE 431* 303* 192* 193*  BUN 23* 25* 24* 24*  CREATININE 0.97 0.89 0.77 0.78  CALCIUM 9.9 9.3 9.4 9.5  MG  --   --   --  2.1   GFR: Estimated Creatinine Clearance: 111 mL/min (by C-G formula based on SCr of 0.78 mg/dL). Liver  Function Tests: Recent Labs  Lab 02/03/24 1526  AST 39  ALT 67*  ALKPHOS 162*  BILITOT 1.2  PROT 7.2  ALBUMIN 3.7   No results for input(s): LIPASE, AMYLASE in the last 168 hours. No results for input(s): AMMONIA in the last 168 hours. Coagulation Profile: No results for input(s): INR, PROTIME in the last 168 hours. Cardiac Enzymes: No results for input(s): CKTOTAL, CKMB, CKMBINDEX, TROPONINI in the last 168 hours. BNP (last 3 results) No results for input(s): PROBNP in the last 8760 hours. HbA1C: No results for input(s): HGBA1C in the last 72 hours. CBG: Recent Labs  Lab 02/04/24 0237 02/04/24 0326 02/04/24 0433 02/04/24 0528 02/04/24 0630  GLUCAP 162* 168* 140* 132* 135*   Lipid Profile: No results for input(s): CHOL, HDL, LDLCALC, TRIG, CHOLHDL, LDLDIRECT in the last 72 hours. Thyroid Function Tests: No results for input(s): TSH, T4TOTAL, FREET4, T3FREE, THYROIDAB in the last 72 hours. Anemia Panel: No results for input(s): VITAMINB12, FOLATE, FERRITIN, TIBC, IRON, RETICCTPCT in the last 72 hours. Sepsis Labs: Recent Labs  Lab 02/03/24 1751 02/03/24 2030  LATICACIDVEN 2.6* 2.4*    Recent Results (from the past 240 hours)  Blood culture (routine x 2)     Status: None (Preliminary result)   Collection Time: 02/03/24  5:55 PM   Specimen: BLOOD LEFT ARM  Result Value Ref Range Status   Specimen Description   Final    BLOOD LEFT ARM Performed at [facility], [address] [place], [place] [address]    Special Requests   Final    BOTTLES DRAWN AEROBIC AND ANAEROBIC Blood Culture adequate volume Performed at [facility], [address], [place], [place] [address]    Culture PENDING  Incomplete   Report Status PENDING  Incomplete  Blood culture (routine x 2)     Status: None (Preliminary result)   Collection Time: 02/03/24  5:55 PM   Specimen: BLOOD LEFT ARM  Result Value Ref  Range Status   Specimen Description   Final    BLOOD LEFT ARM Performed at [facility], [address] [place], [place] [address]    Special Requests   Final    BOTTLES DRAWN AEROBIC AND ANAEROBIC Blood Culture adequate volume Performed at [facility], [address], [place], [place] [address]    Culture PENDING  Incomplete   Report Status PENDING  Incomplete  MRSA Next Gen by PCR, Nasal     Status: None   Collection Time: 02/04/24 12:54 AM   Specimen: Nasal Mucosa; Nasal Swab  Result Value Ref Range Status   MRSA by PCR Next Gen NOT DETECTED NOT DETECTED Final    Comment: (NOTE) The GeneXpert MRSA Assay (FDA approved for NASAL specimens only), is one component of a comprehensive MRSA colonization  surveillance program. It is not intended to diagnose MRSA infection nor to guide or monitor treatment for MRSA infections. Test performance is not FDA approved in patients less than 65 years old. Performed at [facility], [address], [place], [place] [address]          Radiology Studies: CT Temporal Bones W Contrast Result Date: 02/03/2024 EXAM: CT TEMPORAL BONES WITH CONTRAST 02/03/2024 06:18:21 PM TECHNIQUE: CT of the temporal bones was performed with the administration of intravenous contrast. 75mL iohexol  (OMNIPAQUE ) 300 MG/ML solution was used. Multiplanar reformatted images are provided for review. Automated exposure control, iterative reconstruction, and/or weight based adjustment of the mA/kV was utilized to reduce the radiation dose to as low as reasonably achievable. COMPARISON: None available. CLINICAL HISTORY: Mastoiditis. Notes from triage: PT arrived with Mom for recent ear infection and on last day of antibiotics without improvement. Pt has hx of DM and currently hyperglycemic. Body aches present, afebrile. FINDINGS: RIGHT TEMPORAL BONE: EXTERNAL AUDITORY CANAL: Clear. No bony erosion. Scutum is intact. MIDDLE EAR CAVITY:  Clear. Ossicular chain is intact. MASTOID AIR CELLS: Clear. INNER EAR: The cochlea and vestibule are unremarkable. Normal mineralization of the otic capsule. Normal semicircular canals. The vestibular aqueduct is not dilated. INTERNAL AUDITORY CANAL: Unremarkable. Normal bony canal of the facial nerve. LEFT TEMPORAL BONE: EXTERNAL AUDITORY CANAL: Soft tissue thickening in the external auditory canal. MIDDLE EAR CAVITY: Left middle ear effusion. Ossicles are intact. Tegmen Tympani is intact. MASTOID AIR CELLS: Left mastoid effusion without coalescence. INNER EAR: The cochlea and vestibule are unremarkable. Normal mineralization of the otic capsule. Normal semicircular canals. The vestibular aqueduct is not dilated. INTERNAL AUDITORY CANAL: Unremarkable. Normal bony canal of the facial nerve. VASCULAR: Normal jugular bulbs. Normal carotid canals. BRAIN: Unremarkable. ORBITS: No acute abnormality. SINUSES: Mild paranasal sinus mucosal thickening. IMPRESSION: 1. Left otomastoidiits without coalescence or infratemporal abscess 2. Soft tissue thickening in the left external auditory canal which may indicate otitis externa . Electronically signed by: Franky Stanford MD 02/03/2024 07:20 PM EDT RP Workstation: [id number]   CT Head W or Wo Contrast Result Date: 02/03/2024 CLINICAL DATA:  Headache, sudden, severe.  Recent ear infection. EXAM: CT HEAD WITHOUT AND WITH CONTRAST TECHNIQUE: Contiguous axial images were obtained from the base of the skull through the vertex without and with intravenous contrast. RADIATION DOSE REDUCTION: This exam was performed according to the departmental dose-optimization program which includes automated exposure control, adjustment of the mA and/or kV according to patient size and/or use of iterative reconstruction technique. CONTRAST:  75mL OMNIPAQUE  IOHEXOL  300 MG/ML  SOLN COMPARISON:  Head CT 09/30/2023 FINDINGS: Brain: There is no evidence of an acute infarct, intracranial hemorrhage, mass,  midline shift, or extra-axial fluid collection. Cerebral volume is normal. The ventricles are normal in size. No abnormal enhancement is identified. Vascular: The major dural venous sinuses and large intracranial arteries are grossly patent on this nondedicated study. Skull: No fracture or suspicious lesion. Sinuses/Orbits: Mild mucosal thickening in the included paranasal sinuses. Left mastoid and left middle ear opacification, more fully evaluated on the separate dedicated CT of the temporal bones. Unremarkable orbits. Other: None. IMPRESSION: 1. No evidence of acute intracranial abnormality. 2. Temporal bone CT reported separately. Electronically Signed   By: Dasie Hamburg M.D.   On: 02/03/2024 18:39        Scheduled Meds:  acidophilus  2 capsule Oral TID   Chlorhexidine  Gluconate Cloth  6 each Topical Q0600   enoxaparin  (LOVENOX ) injection  40 mg  Subcutaneous Q24H   feeding supplement  1 Container Oral TID BM   insulin  aspart  0-5 Units Subcutaneous QHS   insulin  aspart  0-9 Units Subcutaneous TID WC   insulin  glargine  20 Units Subcutaneous Q24H   Continuous Infusions:  dextrose  5% lactated ringers  125 mL/hr at 02/04/24 0330   insulin  2.8 Units/hr (02/04/24 0330)   levofloxacin  (LEVAQUIN ) IV Stopped (02/03/24 2201)     LOS: 1 day   Time spent:  Ryan JAYSON Montclair, DO [facility]  If 7PM-7AM, please contact night-coverage [email or web address]  02/04/2024, 7:39 AM

## 2024-02-04 NOTE — Plan of Care (Signed)
  Problem: Clinical Measurements: Goal: Ability to maintain clinical measurements within normal limits will improve Outcome: Progressing Goal: Will remain free from infection Outcome: Progressing Goal: Diagnostic test results will improve Outcome: Progressing Goal: Cardiovascular complication will be avoided Outcome: Progressing   Problem: Nutrition: Goal: Adequate nutrition will be maintained Outcome: Progressing   Problem: Elimination: Goal: Will not experience complications related to bowel motility Outcome: Progressing Goal: Will not experience complications related to urinary retention Outcome: Progressing   Problem: Health Behavior/Discharge Planning: Goal: Ability to manage health-related needs will improve Outcome: Not Progressing

## 2024-02-05 DIAGNOSIS — H60502 Unspecified acute noninfective otitis externa, left ear: Secondary | ICD-10-CM

## 2024-02-05 LAB — HEMOGLOBIN A1C
Hgb A1c MFr Bld: 11.7 % — ABNORMAL HIGH (ref 4.8–5.6)
Mean Plasma Glucose: 289.09 mg/dL

## 2024-02-05 LAB — GLUCOSE, CAPILLARY
Glucose-Capillary: 229 mg/dL — ABNORMAL HIGH (ref 70–99)
Glucose-Capillary: 243 mg/dL — ABNORMAL HIGH (ref 70–99)

## 2024-02-05 LAB — BASIC METABOLIC PANEL WITH GFR
Anion gap: 11 (ref 5–15)
BUN: 18 mg/dL (ref 6–20)
CO2: 25 mmol/L (ref 22–32)
Calcium: 9.3 mg/dL (ref 8.9–10.3)
Chloride: 98 mmol/L (ref 98–111)
Creatinine, Ser: 0.62 mg/dL (ref 0.61–1.24)
GFR, Estimated: >60 mL/min (ref >60)
Glucose, Bld: 78 mg/dL (ref 70–99)
Potassium: 3.7 mmol/L (ref 3.5–5.1)
Sodium: 134 mmol/L — ABNORMAL LOW (ref 135–145)

## 2024-02-05 MED ORDER — HYDRALAZINE HCL 20 MG/ML IJ SOLN
5.0000 mg | Freq: Four times a day (QID) | INTRAMUSCULAR | Status: DC | PRN
Start: 2024-02-05 — End: 2024-02-05
  Administered 2024-02-05: 5 mg via INTRAVENOUS
  Filled 2024-02-05: qty 1

## 2024-02-05 MED ORDER — OXYCODONE HCL 5 MG PO TABS: 5.0000 mg | ORAL_TABLET | ORAL | Status: DC | PRN

## 2024-02-05 MED ORDER — CIPROFLOXACIN-DEXAMETHASONE 0.3-0.1 % OT SUSP
4.0000 [drp] | Freq: Two times a day (BID) | OTIC | Status: DC
Start: 2024-02-05 — End: 2024-02-05
  Administered 2024-02-05: 4 [drp] via OTIC
  Filled 2024-02-05: qty 7.5

## 2024-02-05 MED ORDER — KETOROLAC TROMETHAMINE 15 MG/ML IJ SOLN: 15.0000 mg | Freq: Four times a day (QID) | INTRAMUSCULAR | Status: DC | PRN

## 2024-02-05 NOTE — Inpatient Diabetes Management (Addendum)
 Inpatient Diabetes Program Recommendations  AACE/ADA: New Consensus Statement on Inpatient Glycemic Control (2015)  Target Ranges:  Prepandial:   less than 140 mg/dL      Peak postprandial:   less than 180 mg/dL (1-2 hours)      Critically ill patients:  140 - 180 mg/dL   Lab Results  Component Value Date   GLUCAP 243 (H) 02/05/2024   HGBA1C 13.8 (H) 02/16/2019    Review of Glycemic Control  Diabetes history: T1DM Outpatient Diabetes medications: Lantus  35 daily, Novolog  0-15 TID with meals (1:30 > 150), Dexcom G7 CGM Current orders for Inpatient glycemic control: Lantus  20 BID, Novolog  0-9 TID with meals and 0-5 HS  HgbA1C - 12.2% (02/01/2024) Endo: Dr Vicky with [facility]  Inpatient Diabetes Program Recommendations:    Decrease Lantus  HS dose to 15 units at bedtime. Continue Lantus  20 units QAM  Add Novolog  3 units TID with meals to cover carbs in meal  Spoke with pt at bedside this morning. States he takes Lantus  35 units daily and can't remember what his sliding scale is. Does not count carbs. Pt was very fidgety and was not interested in talking about his diabetes control or HgbA1C of 12.2%. States he is homeless, although said his Mother will be helping him after discharge.  Will need to speak with Mother and again with pt when he's not so anxious.  Will f/u in am.   Thank you. Shona Brandy, RD, LDN, CDCES Inpatient Diabetes Coordinator [phone]  Correction Scale - from Endo Inject correction dose per scale below before meals and at bedtime up to 4 times daily.  BG < 70 No injection & Treat low blood sugar BG = 70-150 No injection BG = 151-180 1 unit BG = 181-210 2 units BG = 211-240 3 units BG = 241-270 4 units BG = 271-300 5 units BG = 301-330 6 units BG = 331-360 7 units BG = 361-390 8 unit BG = 391- 420 9 units BG > 420 10 units and recheck after 2 hours. If still > 400, then call the clinic at [phone].

## 2024-02-05 NOTE — Plan of Care (Signed)
  Problem: Clinical Measurements: Goal: Ability to maintain clinical measurements within normal limits will improve Outcome: Progressing Goal: Will remain free from infection Outcome: Progressing Goal: Diagnostic test results will improve Outcome: Progressing Goal: Cardiovascular complication will be avoided Outcome: Progressing   Problem: Nutrition: Goal: Adequate nutrition will be maintained Outcome: Progressing   Problem: Coping: Goal: Level of anxiety will decrease Outcome: Progressing

## 2024-02-05 NOTE — Progress Notes (Signed)
 PROGRESS NOTE    ARGEL PABLO  FMW:[id number] DOB: 02/26/1976 DOA: 02/03/2024 PCP: [facility]   Brief Narrative:  TAIKI BUCKWALTER is a 48 y.o. 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  He presents with 8 days of severe left ear pain and headache headache, postnasal drip, severe left ear pain, and some mild sore throat.  Patient found to be in DKA at intake, CT confirms mastoiditis/otitis externa likely the cause of patient's uncontrolled glucose.  Hospitalist called for admission.  Assessment & Plan:   Diabetic ketoacidosis in the setting of known history of insulin -dependent diabetes mellitus Patient's DKA has resolved.  He has been transitioned to subcutaneous insulin .  He does take Lantus  insulin  at baseline. Monitor CBGs.  Diabetes coordinator to see the patient today. May need dose adjustment of his glargine.   Left otitis externa/mastoiditis/severe sepsis  Failure of outpatient antibiotics with erythromycin Patient was started on levofloxacin .  Case was discussed with ENT who also recommended Ciprodex  drops which has not been initiated yet.  Will order. Patient with a lot of discomfort.  Will adjust his pain medication regimen. Also experiencing some hearing loss in the left ear.  Along with tinnitus.  He will need outpatient ENT evaluation including audiological testing after appropriate treatment of his acute infection.  History of drug abuse - Patient reports he no longer is an IV drug user.  Sober x 2 years. - UDS remarkable for opiates-unclear if secondary to morphine  given in the ED   DVT prophylaxis: enoxaparin  (LOVENOX ) injection 40 mg Start: 02/03/24 2200 Code Status:   Code Status: Full Code Family Communication: None present Disposition: Hopefully home in 24 to 48 hours.  Consultants:  Phone conversation with ENT by EDP  Procedures:  None  Antimicrobials:  Levaquin   Subjective: Patient complains of 7 out of  10 pain in the left ear area.  Complains of muffled hearing and ringing in the ear.  No shortness of breath.  No nausea vomiting.  Has been compliant with his insulin  regimen at home.  Objective: Vitals:   02/05/24 0600 02/05/24 0700 02/05/24 0805 02/05/24 0900  BP: (!) 146/103 (!) 176/115  (!) 164/112  Pulse: 94 91  94  Resp: 12 10  10   Temp:   99.2 F (37.3 C)   TempSrc:   Oral   SpO2: 98% 98%  98%  Weight:      Height:        Intake/Output Summary (Last 24 hours) at 02/05/2024 1006 Last data filed at 02/05/2024 0800 Gross per 24 hour  Intake 390 ml  Output 700 ml  Net -310 ml   Filed Weights   02/03/24 1457 02/04/24 1238  Weight: 68 kg 62.7 kg    Examination:  General appearance: Awake alert.  In no distress Redness of the left ear is noted.  No swelling behind the ear.  Some tenderness is appreciated over the mastoid. Resp: Clear to auscultation bilaterally.  Normal effort Cardio: S1-S2 is normal regular.  No S3-S4.  No rubs murmurs or bruit GI: Abdomen is soft.  Nontender nondistended.  Bowel sounds are present normal.  No masses organomegaly Extremities: No edema.  Full range of motion of lower extremities. Neurologic: Alert and oriented x3.  No focal neurological deficits.     Data Reviewed:  CBC: Recent Labs  Lab 02/03/24 1526 02/04/24 0530  WBC 16.1* 13.6*  NEUTROABS 13.9*  --   HGB 15.6 13.0  HCT  46.1 38.8*  MCV 84.9 83.3  PLT 279 250   Basic Metabolic Panel: Recent Labs  Lab 02/04/24 0128 02/04/24 0530 02/04/24 1327 02/04/24 1635 02/05/24 0305  NA 131* 133* 130* 132* 134*  K 5.4* 5.4* 5.0 4.3 3.7  CL 99 98 93* 96* 98  CO2 23 18* 24 27 25   GLUCOSE 192* 193* 481* 269* 78  BUN 24* 24* 22* 22* 18  CREATININE 0.77 0.78 1.03 0.96 0.62  CALCIUM 9.4 9.5 9.3 9.3 9.3  MG  --  2.1  --   --   --    GFR: Estimated Creatinine Clearance: 102.3 mL/min (by C-G formula based on SCr of 0.62 mg/dL). Liver Function Tests: Recent Labs  Lab 02/03/24 1526   AST 39  ALT 67*  ALKPHOS 162*  BILITOT 1.2  PROT 7.2  ALBUMIN 3.7    CBG: Recent Labs  Lab 02/04/24 1541 02/04/24 1654 02/04/24 1852 02/04/24 2137 02/05/24 0739  GLUCAP 345* 263* 229* 289* 229*    Sepsis Labs: Recent Labs  Lab 02/03/24 1751 02/03/24 2030  LATICACIDVEN 2.6* 2.4*    Recent Results (from the past 240 hours)  Blood culture (routine x 2)     Status: None (Preliminary result)   Collection Time: 02/03/24  5:55 PM   Specimen: BLOOD LEFT ARM  Result Value Ref Range Status   Specimen Description   Final    BLOOD LEFT ARM Performed at [facility], [address], [place], [place] [address]    Special Requests   Final    BOTTLES DRAWN AEROBIC AND ANAEROBIC Blood Culture adequate volume Performed at [facility], [address] [place], [place] [address]    Culture   Final    NO GROWTH 2 DAYS Performed at [facility], [address] [place], [place] [address]    Report Status PENDING  Incomplete  Blood culture (routine x 2)     Status: None (Preliminary result)   Collection Time: 02/03/24  5:55 PM   Specimen: BLOOD LEFT ARM  Result Value Ref Range Status   Specimen Description   Final    BLOOD LEFT ARM Performed at [facility], [address] [place], [place] [address]    Special Requests   Final    BOTTLES DRAWN AEROBIC AND ANAEROBIC Blood Culture adequate volume Performed at [facility], [address] [place], [place] [address]    Culture   Final    NO GROWTH 2 DAYS Performed at [facility], [address], [place], [place] [address]    Report Status PENDING  Incomplete  MRSA Next Gen by PCR, Nasal     Status: None   Collection Time: 02/04/24 12:54 AM   Specimen: Nasal Mucosa; Nasal Swab  Result Value Ref Range Status   MRSA by PCR Next Gen NOT DETECTED NOT DETECTED Final    Comment: (NOTE) The GeneXpert MRSA Assay (FDA approved for NASAL specimens only), is one  component of a comprehensive MRSA colonization surveillance program. It is not intended to diagnose MRSA infection nor to guide or monitor treatment for MRSA infections. Test performance is not FDA approved in patients less than 41 years old. Performed at [facility], [address] [place], [place] [address]          Radiology Studies: CT Temporal Bones W Contrast Result Date: 02/03/2024 EXAM: CT TEMPORAL BONES WITH CONTRAST 02/03/2024 06:18:21 PM TECHNIQUE: CT of the temporal  bones was performed with the administration of intravenous contrast. 75mL iohexol  (OMNIPAQUE ) 300 MG/ML solution was used. Multiplanar reformatted images are provided for review. Automated exposure control, iterative reconstruction, and/or weight based adjustment of the mA/kV was utilized to reduce the radiation dose to as low as reasonably achievable. COMPARISON: None available. CLINICAL HISTORY: Mastoiditis. Notes from triage: PT arrived with Mom for recent ear infection and on last day of antibiotics without improvement. Pt has hx of DM and currently hyperglycemic. Body aches present, afebrile. FINDINGS: RIGHT TEMPORAL BONE: EXTERNAL AUDITORY CANAL: Clear. No bony erosion. Scutum is intact. MIDDLE EAR CAVITY: Clear. Ossicular chain is intact. MASTOID AIR CELLS: Clear. INNER EAR: The cochlea and vestibule are unremarkable. Normal mineralization of the otic capsule. Normal semicircular canals. The vestibular aqueduct is not dilated. INTERNAL AUDITORY CANAL: Unremarkable. Normal bony canal of the facial nerve. LEFT TEMPORAL BONE: EXTERNAL AUDITORY CANAL: Soft tissue thickening in the external auditory canal. MIDDLE EAR CAVITY: Left middle ear effusion. Ossicles are intact. Tegmen Tympani is intact. MASTOID AIR CELLS: Left mastoid effusion without coalescence. INNER EAR: The cochlea and vestibule are unremarkable. Normal mineralization of the otic capsule. Normal semicircular canals. The vestibular aqueduct  is not dilated. INTERNAL AUDITORY CANAL: Unremarkable. Normal bony canal of the facial nerve. VASCULAR: Normal jugular bulbs. Normal carotid canals. BRAIN: Unremarkable. ORBITS: No acute abnormality. SINUSES: Mild paranasal sinus mucosal thickening. IMPRESSION: 1. Left otomastoidiits without coalescence or infratemporal abscess 2. Soft tissue thickening in the left external auditory canal which may indicate otitis externa . Electronically signed by: Franky Stanford MD 02/03/2024 07:20 PM EDT RP Workstation: [id number]   CT Head W or Wo Contrast Result Date: 02/03/2024 CLINICAL DATA:  Headache, sudden, severe.  Recent ear infection. EXAM: CT HEAD WITHOUT AND WITH CONTRAST TECHNIQUE: Contiguous axial images were obtained from the base of the skull through the vertex without and with intravenous contrast. RADIATION DOSE REDUCTION: This exam was performed according to the departmental dose-optimization program which includes automated exposure control, adjustment of the mA and/or kV according to patient size and/or use of iterative reconstruction technique. CONTRAST:  75mL OMNIPAQUE  IOHEXOL  300 MG/ML  SOLN COMPARISON:  Head CT 09/30/2023 FINDINGS: Brain: There is no evidence of an acute infarct, intracranial hemorrhage, mass, midline shift, or extra-axial fluid collection. Cerebral volume is normal. The ventricles are normal in size. No abnormal enhancement is identified. Vascular: The major dural venous sinuses and large intracranial arteries are grossly patent on this nondedicated study. Skull: No fracture or suspicious lesion. Sinuses/Orbits: Mild mucosal thickening in the included paranasal sinuses. Left mastoid and left middle ear opacification, more fully evaluated on the separate dedicated CT of the temporal bones. Unremarkable orbits. Other: None. IMPRESSION: 1. No evidence of acute intracranial abnormality. 2. Temporal bone CT reported separately. Electronically Signed   By: Dasie Hamburg M.D.   On: 02/03/2024  18:39    Scheduled Meds:  acidophilus  2 capsule Oral TID   Chlorhexidine  Gluconate Cloth  6 each Topical Q0600   ciprofloxacin -dexamethasone   4 drop Left EAR BID   enoxaparin  (LOVENOX ) injection  40 mg Subcutaneous Q24H   feeding supplement  1 Container Oral TID BM   insulin  aspart  0-5 Units Subcutaneous QHS   insulin  aspart  0-9 Units Subcutaneous TID WC   insulin  glargine  20 Units Subcutaneous BID   pneumococcal 20-valent conjugate vaccine  0.5 mL Intramuscular Tomorrow-1000   Continuous Infusions:  levofloxacin  (LEVAQUIN ) IV Stopped (02/04/24 2208)     LOS: 2 days    Emilyrose Darrah  [place],  [facility]  If 7PM-7AM, please contact night-coverage [email or web address]  02/05/2024, 10:06 AM

## 2024-02-05 NOTE — Discharge Summary (Signed)
 [facility]  Physician Discharge Summary   Patient ID: Ryan Richard MRN: [id number] DOB/AGE: May 06, 1976 48 y.o.  Admit date: 02/03/2024 Discharge date: 02/05/2024  ***  PCP: [facility]  DISCHARGE DIAGNOSES:  Principal Problem:   DKA (diabetic ketoacidosis) (HCC) Active Problems:   Type 1 diabetes mellitus (HCC)   Constipation   Polysubstance abuse (HCC)   RECOMMENDATIONS FOR OUTPATIENT FOLLOW UP: *** (include homehealth, outpatient follow-up instructions, specific recommendations for PCP to follow-up on, etc.)   Home Health:***  Equipment/Devices:***   CODE STATUS:***   DISCHARGE CONDITION: {condition:18240}  Diet recommendation: ***  INITIAL HISTORY: ***  Consultations: ***  Procedures: *** (i.e. Studies not automatically included, echos, thoracentesis, etc; not x-rays)  HOSPITAL COURSE: ***         Estimated body mass index is 19.83 kg/m as calculated from the following:   Height as of this encounter: 5' 10 (1.778 m).   Weight as of this encounter: 62.7 kg.              PERTINENT LABS:  The results of significant diagnostics from this hospitalization (including imaging, microbiology, ancillary and laboratory) are listed below for reference.    Microbiology: Recent Results (from the past 240 hours)  Blood culture (routine x 2)     Status: None (Preliminary result)   Collection Time: 02/03/24  5:55 PM   Specimen: BLOOD LEFT ARM  Result Value Ref Range Status   Specimen Description   Final    BLOOD LEFT ARM Performed at [facility], [address], [place], [place] [address]    Special Requests   Final    BOTTLES DRAWN AEROBIC AND ANAEROBIC Blood Culture adequate volume Performed at [facility], [address], [place], [place] [address]    Culture   Final    NO GROWTH 2 DAYS Performed at [facility], [address], [place], [place] [address]    Report Status PENDING   Incomplete  Blood culture (routine x 2)     Status: None (Preliminary result)   Collection Time: 02/03/24  5:55 PM   Specimen: BLOOD LEFT ARM  Result Value Ref Range Status   Specimen Description   Final    BLOOD LEFT ARM Performed at [facility], [address] [place], [place] [address]    Special Requests   Final    BOTTLES DRAWN AEROBIC AND ANAEROBIC Blood Culture adequate volume Performed at [facility], [address], [place], [place] [address]    Culture   Final    NO GROWTH 2 DAYS Performed at [facility], [address], [place], [place] [address]    Report Status PENDING  Incomplete  MRSA Next Gen by PCR, Nasal     Status: None   Collection Time: 02/04/24 12:54 AM   Specimen: Nasal Mucosa; Nasal Swab  Result Value Ref Range Status   MRSA by PCR Next Gen NOT DETECTED NOT DETECTED Final    Comment: (NOTE) The GeneXpert MRSA Assay (FDA approved for NASAL specimens only), is one component of a comprehensive MRSA colonization surveillance program. It is not intended to diagnose MRSA infection nor to guide or monitor treatment for MRSA infections. Test performance is not FDA approved in patients less than 25 years old. Performed at [facility], [address] [place], [place] [address]      Labs:   Basic Metabolic Panel: Recent Labs  Lab 02/04/24 0128 02/04/24  0530 02/04/24 1327 02/04/24 1635 02/05/24 0305  NA 131* 133* 130* 132* 134*  K 5.4* 5.4* 5.0 4.3 3.7  CL 99 98 93* 96* 98  CO2 23 18* 24 27 25   GLUCOSE 192* 193* 481* 269* 78  BUN 24* 24* 22* 22* 18  CREATININE 0.77 0.78 1.03 0.96 0.62  CALCIUM 9.4 9.5 9.3 9.3 9.3  MG  --  2.1  --   --   --    Liver Function Tests: Recent Labs  Lab 02/03/24 1526  AST 39  ALT 67*  ALKPHOS 162*  BILITOT 1.2  PROT 7.2  ALBUMIN 3.7   No results for input(s): LIPASE, AMYLASE in the last 168 hours. No results for input(s): AMMONIA in the last 168  hours. CBC: Recent Labs  Lab 02/03/24 1526 02/04/24 0530  WBC 16.1* 13.6*  NEUTROABS 13.9*  --   HGB 15.6 13.0  HCT 46.1 38.8*  MCV 84.9 83.3  PLT 279 250   Cardiac Enzymes: No results for input(s): CKTOTAL, CKMB, CKMBINDEX, TROPONINI in the last 168 hours. BNP: BNP (last 3 results) No results for input(s): BNP in the last 8760 hours.  ProBNP (last 3 results) No results for input(s): PROBNP in the last 8760 hours.  CBG: Recent Labs  Lab 02/04/24 1654 02/04/24 1852 02/04/24 2137 02/05/24 0739 02/05/24 1152  GLUCAP 263* 229* 289* 229* 243*     IMAGING STUDIES CT Temporal Bones W Contrast Result Date: 02/03/2024 EXAM: CT TEMPORAL BONES WITH CONTRAST 02/03/2024 06:18:21 PM TECHNIQUE: CT of the temporal bones was performed with the administration of intravenous contrast. 75mL iohexol  (OMNIPAQUE ) 300 MG/ML solution was used. Multiplanar reformatted images are provided for review. Automated exposure control, iterative reconstruction, and/or weight based adjustment of the mA/kV was utilized to reduce the radiation dose to as low as reasonably achievable. COMPARISON: None available. CLINICAL HISTORY: Mastoiditis. Notes from triage: PT arrived with Mom for recent ear infection and on last day of antibiotics without improvement. Pt has hx of DM and currently hyperglycemic. Body aches present, afebrile. FINDINGS: RIGHT TEMPORAL BONE: EXTERNAL AUDITORY CANAL: Clear. No bony erosion. Scutum is intact. MIDDLE EAR CAVITY: Clear. Ossicular chain is intact. MASTOID AIR CELLS: Clear. INNER EAR: The cochlea and vestibule are unremarkable. Normal mineralization of the otic capsule. Normal semicircular canals. The vestibular aqueduct is not dilated. INTERNAL AUDITORY CANAL: Unremarkable. Normal bony canal of the facial nerve. LEFT TEMPORAL BONE: EXTERNAL AUDITORY CANAL: Soft tissue thickening in the external auditory canal. MIDDLE EAR CAVITY: Left middle ear effusion. Ossicles are intact.  Tegmen Tympani is intact. MASTOID AIR CELLS: Left mastoid effusion without coalescence. INNER EAR: The cochlea and vestibule are unremarkable. Normal mineralization of the otic capsule. Normal semicircular canals. The vestibular aqueduct is not dilated. INTERNAL AUDITORY CANAL: Unremarkable. Normal bony canal of the facial nerve. VASCULAR: Normal jugular bulbs. Normal carotid canals. BRAIN: Unremarkable. ORBITS: No acute abnormality. SINUSES: Mild paranasal sinus mucosal thickening. IMPRESSION: 1. Left otomastoidiits without coalescence or infratemporal abscess 2. Soft tissue thickening in the left external auditory canal which may indicate otitis externa . Electronically signed by: Franky Stanford MD 02/03/2024 07:20 PM EDT RP Workstation: [id number]   CT Head W or Wo Contrast Result Date: 02/03/2024 CLINICAL DATA:  Headache, sudden, severe.  Recent ear infection. EXAM: CT HEAD WITHOUT AND WITH CONTRAST TECHNIQUE: Contiguous axial images were obtained from the base of the skull through the vertex without and with intravenous contrast. RADIATION DOSE REDUCTION: This exam was performed according to the departmental dose-optimization program which  includes automated exposure control, adjustment of the mA and/or kV according to patient size and/or use of iterative reconstruction technique. CONTRAST:  75mL OMNIPAQUE  IOHEXOL  300 MG/ML  SOLN COMPARISON:  Head CT 09/30/2023 FINDINGS: Brain: There is no evidence of an acute infarct, intracranial hemorrhage, mass, midline shift, or extra-axial fluid collection. Cerebral volume is normal. The ventricles are normal in size. No abnormal enhancement is identified. Vascular: The major dural venous sinuses and large intracranial arteries are grossly patent on this nondedicated study. Skull: No fracture or suspicious lesion. Sinuses/Orbits: Mild mucosal thickening in the included paranasal sinuses. Left mastoid and left middle ear opacification, more fully evaluated on the separate  dedicated CT of the temporal bones. Unremarkable orbits. Other: None. IMPRESSION: 1. No evidence of acute intracranial abnormality. 2. Temporal bone CT reported separately. Electronically Signed   By: Dasie Hamburg M.D.   On: 02/03/2024 18:39    DISCHARGE EXAMINATION: Vitals:   02/05/24 0900 02/05/24 1215 02/05/24 1323 02/05/24 1506  BP: (!) 164/112  (!) 154/104 (!) 139/94  Pulse: 94 95 97 92  Resp: 10 11 11 16   Temp:  99 F (37.2 C)  98 F (36.7 C)  TempSrc:  Oral  Oral  SpO2: 98% 98% 98% 100%  Weight:      Height:       {physical exam:3041130}  DISPOSITION: ***     Current Inpatient Medications: {medication reviewed/display:3041432}  Allergies as of 02/05/2024       Reactions   Penicillins Hives     Med Rec must be completed prior to using this SMARTLINK***          TOTAL DISCHARGE TIME: ***  Joette Pebbles  [facility] Pager on [email or web address]  02/05/2024, 4:55 PM

## 2024-02-05 NOTE — Progress Notes (Addendum)
 The patient's mom arrived to visit patient around 1600. The patient was not in his room. The writer searched around unit and waiting area. Unable to locate patient. Shortly after the patient's mom arrived she received a call from her son and he stated he is at his girlfriend's house. The writer informed the patient's mom that the patient has 2 IV's and they need to be removed as soon as possible. The patient can come back to the ED for removal. Per patient's mom, Patty, she used to be an EMT and she can remove them. The patients mom ensured that she will remove the IV's as soon as possible. F/u call placed to patient's girlfriend and mom with no answer. MD notified at 1645.

## 2024-02-08 LAB — CULTURE, BLOOD (ROUTINE X 2)
Culture: NO GROWTH
Culture: NO GROWTH
Special Requests: ADEQUATE
Special Requests: ADEQUATE

## 2024-02-13 NOTE — Telephone Encounter (Signed)
 Call to [facility] on file that Rx was sent to. Staff reports that they do not see a Rx for sensors at this time.  Informed that the order needs a prior auth, and when obtained, they should be able to see the Rx.  Rx has been released to pharmacy.

## 2024-02-13 NOTE — Telephone Encounter (Signed)
 Patients mother is calling in stating the Sensors are needing PA and is asking this be done urgently. Please advise.

## 2024-03-13 NOTE — Progress Notes (Signed)
  Subjective  Patient ID: Ryan Richard is a 48 y.o. male being seen for:  No chief complaint on file.    HPI   48 year old male with left ear pain and hearing loss for 1-2 months.  He was given 2 courses of oral antibiotics and eardrops.  Without significant improvement.  He had also been having suboptimally controlled diabetes although this has recently improved and is now only in the 200s.  Last visit he was switched to a course of levofloxacin  and notes significant improvement with his ear including pain and hearing.  He does feel some popping and some fluid still in the left ear along with some decreased hearing.  Review of Systems: all relevant systems have been reviewed unless otherwise documented.  Medical History[1]  Surgical History[2]  Family History[3]  Allergies[4]   Objective  Physical Exam: General/Constitutional: Patient is a well-nourished, well-developed in no distress. Answers questions appropriately.  Skin/scalp : Normal and without lesions. No rashes, ulcerations or masses noted.  Head: No facial deformities  Eyes: Vision grossly intact. Normal extraocular movements. No nystagmus noted.  Ears: Right ear: Normal ear canal. Normal tympanic membrane. Left ear: Normal ear canal.  Small amount of serous middle ear fluid. Nose: Septum midline, turbinates slightly enlarged  Oral cavity and oropharynx: No concerning lesions in the oral cavity or pharynx.  Neck: No palpable masses or lesions    Assessment/Plan 1. Non-recurrent acute suppurative otitis media of left ear without spontaneous rupture of tympanic membrane (Primary) He had significant improvement after course of levofloxacin .  I recommend waiting on additional antibiotics although there is still a small amount of middle ear fluid noted.  2. Hearing loss - left A follow-up audiogram is ordered for the next visit.  No orders of the defined types were placed in this encounter.   No follow-ups on  file.   Electronically signed by: Arthea Fries, MD 03/13/2024 11:11 AM         [1] Past Medical History: Diagnosis Date  . Diabetes type 1, controlled   . Infectious viral hepatitis   [2] Past Surgical History: Procedure Laterality Date  . ABDOMINAL HERNIA REPAIR     Procedure: ABDOMINAL HERNIA REPAIR  . OTHER SURGICAL HISTORY     Procedure: OTHER SURGICAL HISTORY (congenital heart surgery)  [3] Family History Problem Relation Name Age of Onset  . Diabetes Maternal Grandfather    . Diabetes Paternal Grandfather    [4] Allergies Allergen Reactions  . Penicillins Rash and Hives
